# Patient Record
Sex: Male | Born: 1975
Health system: Southern US, Community
[De-identification: ages and names within clinical notes are randomized; demographics above are authoritative.]

## PROBLEM LIST (undated history)

## (undated) DIAGNOSIS — R5383 Other fatigue: Secondary | ICD-10-CM

## (undated) DIAGNOSIS — E559 Vitamin D deficiency, unspecified: Secondary | ICD-10-CM

## (undated) DIAGNOSIS — T7840XA Allergy, unspecified, initial encounter: Secondary | ICD-10-CM

## (undated) HISTORY — DX: Other fatigue: R53.83

## (undated) HISTORY — DX: Vitamin D deficiency, unspecified: E55.9

## (undated) HISTORY — DX: Allergy, unspecified, initial encounter: T78.40XA

## (undated) HISTORY — PX: NO PAST SURGERIES: SHX2092

---

## 2011-01-23 DIAGNOSIS — R768 Other specified abnormal immunological findings in serum: Secondary | ICD-10-CM | POA: Insufficient documentation

## 2011-01-23 DIAGNOSIS — R7689 Other specified abnormal immunological findings in serum: Secondary | ICD-10-CM | POA: Insufficient documentation

## 2015-12-02 ENCOUNTER — Ambulatory Visit (INDEPENDENT_AMBULATORY_CARE_PROVIDER_SITE_OTHER): Payer: BLUE CROSS/BLUE SHIELD | Admitting: Family Medicine

## 2015-12-02 ENCOUNTER — Encounter: Payer: Self-pay | Admitting: Family Medicine

## 2015-12-02 VITALS — BP 132/68 | HR 56 | Wt 158.0 lb

## 2015-12-02 DIAGNOSIS — R5383 Other fatigue: Secondary | ICD-10-CM

## 2015-12-02 DIAGNOSIS — Z0000 Encounter for general adult medical examination without abnormal findings: Secondary | ICD-10-CM

## 2015-12-02 DIAGNOSIS — R5382 Chronic fatigue, unspecified: Secondary | ICD-10-CM | POA: Diagnosis not present

## 2015-12-02 DIAGNOSIS — G4700 Insomnia, unspecified: Secondary | ICD-10-CM

## 2015-12-02 DIAGNOSIS — IMO0001 Reserved for inherently not codable concepts without codable children: Secondary | ICD-10-CM

## 2015-12-02 HISTORY — DX: Other fatigue: R53.83

## 2015-12-02 MED ORDER — TRAZODONE HCL 50 MG PO TABS: 25.0000 mg | ORAL_TABLET | Freq: Every evening | ORAL | 3 refills | Status: DC | PRN

## 2015-12-02 NOTE — Progress Notes (Signed)
Victor Jimenez is a 40 y.o. male who presents to [facility]: Primary Care Sports Medicine today for a wellness visit.  Overall, patient is doing very well.  He has no chronic medical issues and doesn't take any medication daily.   His only medical complaint is fatigue.  He admits to day time fatigue several days a week for the last few months.  He also reports difficulty staying asleep several nights a week for the past month.  Patient states he has no issues falling asleep, but will occasionally wake up after an hour of sleep and not be able to fall back asleep.  Patient reports his fatigue usually correlates with the nights he has trouble sleeping.  He denies shortness of breath and weakness.  No decreased libido or difficulty with erections.  No constipation or weight gain.  He denies anhedonia and feeling depressed.  Patient admits to snoring but claims it only happens on nights when he drinks alcohol.  No other complaints.  Health maintenance:  His tetanus was updated last year.  Declines flu shot at this time.    History reviewed. No pertinent past medical history. History reviewed. No pertinent surgical history. Social History  Substance Use Topics  . Smoking status: Light Tobacco Smoker    Types: Cigarettes  . Smokeless tobacco: Never Used  . Alcohol use Yes   family history includes Hyperlipidemia in his mother; Hypertension in his mother.  ROS as above:  No headache, visual changes, nausea, vomiting, diarrhea, constipation, dizziness, abdominal pain, skin rash, fevers, chills, night sweats, weight loss, swollen lymph nodes, body aches, joint swelling, muscle aches, chest pain, shortness of breath, mood changes, visual or auditory hallucinations.   Medications: Current Outpatient Prescriptions  Medication Sig Dispense Refill  . traZODone (DESYREL) 50 MG tablet Take 0.5-1 tablets (25-50 mg  total) by mouth at bedtime as needed for sleep. 30 tablet 3   No current facility-administered medications for this visit.    No Known Allergies   Exam:  BP 132/68   Pulse (!) 56   Wt 158 lb (71.7 kg)  Gen: Well NAD HEENT: EOMI,  MMM Lungs: Normal work of breathing. CTABL Heart: RRR no MRG Abd: NABS, Soft. Nondistended, Nontender Exts: Brisk capillary refill, warm and well perfused.  Psych: Normal mood and affect.  Normal speech  Depression screen [facility] 2/9 12/02/2015  Decreased Interest 0  Down, Depressed, Hopeless 0  PHQ - 2 Score 0  Altered sleeping 1  Tired, decreased energy 1  Change in appetite 0  Feeling bad or failure about yourself  0  Trouble concentrating 0  Moving slowly or fidgety/restless 0  Suicidal thoughts 0  PHQ-9 Score 2   GAD 7 : Generalized Anxiety Score 12/02/2015  Nervous, Anxious, on Edge 0  Control/stop worrying 0  Worry too much - different things 0  Trouble relaxing 0  Restless 0  Easily annoyed or irritable 0  Afraid - awful might happen 0  Total GAD 7 Score 0  Anxiety Difficulty Not difficult at all      Assessment and Plan: 40 y.o. male with:  1.  Wellness visit: Patient is doing very well.  He has only one minor medical complaint discussed below.  We will obtain usual fasting labs (CBC, CMP, TSH, A1c, Lipids, and vitamin D).  2.  Fatigue:  Likely related to insomnia. No symptoms of hypothyroidism, low testosterone, or depression.  - Counseled on sleep hygiene - Melatonin OTC -  Follow up if not improved and we can consider Trazodone  3. Health maintenance: Declines flu shot.     Orders Placed This Encounter  Procedures  . CBC  . Comprehensive metabolic panel    Order Specific Question:   Has the patient fasted?    Answer:   No  . TSH  . Hemoglobin A1c  . Lipid panel    Order Specific Question:   Has the patient fasted?    Answer:   No  . VITAMIN D 25 Hydroxy (Vit-D Deficiency, Fractures)    Discussed warning signs or  symptoms. Please see discharge instructions. Patient expresses understanding.

## 2015-12-02 NOTE — Patient Instructions (Signed)
Thank you for coming in today.  Come in at your convenience for fasting labs in the morning. Try over the counter melatonin at night before bed If no improvement, we can try Trazadone  Insomnia Insomnia is a sleep disorder that makes it difficult to fall asleep or to stay asleep. Insomnia can cause tiredness (fatigue), low energy, difficulty concentrating, mood swings, and poor performance at work or school.  There are three different ways to classify insomnia:  Difficulty falling asleep.  Difficulty staying asleep.  Waking up too early in the morning. Any type of insomnia can be long-term (chronic) or short-term (acute). Both are common. Short-term insomnia usually lasts for three months or less. Chronic insomnia occurs at least three times a week for longer than three months. CAUSES  Insomnia may be caused by another condition, situation, or substance, such as:  Anxiety.  Certain medicines.  Gastroesophageal reflux disease (GERD) or other gastrointestinal conditions.  Asthma or other breathing conditions.  Restless legs syndrome, sleep apnea, or other sleep disorders.  Chronic pain.  Menopause. This may include hot flashes.  Stroke.  Abuse of alcohol, tobacco, or illegal drugs.  Depression.  Caffeine.   Neurological disorders, such as Alzheimer disease.  An overactive thyroid (hyperthyroidism). The cause of insomnia may not be known. RISK FACTORS Risk factors for insomnia include:  Gender. Women are more commonly affected than men.  Age. Insomnia is more common as you get older.  Stress. This may involve your professional or personal life.  Income. Insomnia is more common in people with lower income.  Lack of exercise.   Irregular work schedule or night shifts.  Traveling between different time zones. SIGNS AND SYMPTOMS If you have insomnia, trouble falling asleep or trouble staying asleep is the main symptom. This may lead to other symptoms, such  as:  Feeling fatigued.  Feeling nervous about going to sleep.  Not feeling rested in the morning.  Having trouble concentrating.  Feeling irritable, anxious, or depressed. TREATMENT  Treatment for insomnia depends on the cause. If your insomnia is caused by an underlying condition, treatment will focus on addressing the condition. Treatment may also include:   Medicines to help you sleep.  Counseling or therapy.  Lifestyle adjustments. HOME CARE INSTRUCTIONS   Take medicines only as directed by your health care provider.  Keep regular sleeping and waking hours. Avoid naps.  Keep a sleep diary to help you and your health care provider figure out what could be causing your insomnia. Include:   When you sleep.  When you wake up during the night.  How well you sleep.   How rested you feel the next day.  Any side effects of medicines you are taking.  What you eat and drink.   Make your bedroom a comfortable place where it is easy to fall asleep:  Put up shades or special blackout curtains to block light from outside.  Use a white noise machine to block noise.  Keep the temperature cool.   Exercise regularly as directed by your health care provider. Avoid exercising right before bedtime.  Use relaxation techniques to manage stress. Ask your health care provider to suggest some techniques that may work well for you. These may include:  Breathing exercises.  Routines to release muscle tension.  Visualizing peaceful scenes.  Cut back on alcohol, caffeinated beverages, and cigarettes, especially close to bedtime. These can disrupt your sleep.  Do not overeat or eat spicy foods right before bedtime. This can lead to  digestive discomfort that can make it hard for you to sleep.  Limit screen use before bedtime. This includes:  Watching TV.  Using your smartphone, tablet, and computer.  Stick to a routine. This can help you fall asleep faster. Try to do a  quiet activity, brush your teeth, and go to bed at the same time each night.  Get out of bed if you are still awake after 15 minutes of trying to sleep. Keep the lights down, but try reading or doing a quiet activity. When you feel sleepy, go back to bed.  Make sure that you drive carefully. Avoid driving if you feel very sleepy.  Keep all follow-up appointments as directed by your health care provider. This is important. SEEK MEDICAL CARE IF:   You are tired throughout the day or have trouble in your daily routine due to sleepiness.  You continue to have sleep problems or your sleep problems get worse. SEEK IMMEDIATE MEDICAL CARE IF:   You have serious thoughts about hurting yourself or someone else.   This information is not intended to replace advice given to you by your health care provider. Make sure you discuss any questions you have with your health care provider.   Document Released: 03/10/2000 Document Revised: 12/02/2014 Document Reviewed: 12/12/2013 Elsevier Interactive Patient Education [employer]2016 Elsevier Inc.

## 2015-12-03 LAB — COMPREHENSIVE METABOLIC PANEL
ALBUMIN: 4.8 g/dL (ref 3.6–5.1)
ALK PHOS: 75 U/L (ref 40–115)
ALT: 21 U/L (ref 9–46)
AST: 21 U/L (ref 10–40)
BILIRUBIN TOTAL: 0.7 mg/dL (ref 0.2–1.2)
BUN: 23 mg/dL (ref 7–25)
CO2: 27 mmol/L (ref 20–31)
Calcium: 9.3 mg/dL (ref 8.6–10.3)
Chloride: 106 mmol/L (ref 98–110)
Creat: 1.07 mg/dL (ref 0.60–1.35)
GLUCOSE: 88 mg/dL (ref 65–99)
Potassium: 4.1 mmol/L (ref 3.5–5.3)
SODIUM: 141 mmol/L (ref 135–146)
Total Protein: 7.2 g/dL (ref 6.1–8.1)

## 2015-12-03 LAB — LIPID PANEL
CHOL/HDL RATIO: 4.6 ratio (ref <=5.0)
Cholesterol: 223 mg/dL — ABNORMAL HIGH (ref 125–200)
HDL: 49 mg/dL (ref >=40)
LDL CALC: 158 mg/dL — AB (ref <130)
Triglycerides: 81 mg/dL (ref <150)
VLDL: 16 mg/dL (ref <30)

## 2015-12-03 LAB — TSH: TSH: 1.18 mIU/L (ref 0.40–4.50)

## 2015-12-03 LAB — CBC
HCT: 43.7 % (ref 38.5–50.0)
HEMOGLOBIN: 15.1 g/dL (ref 13.2–17.1)
MCH: 30 pg (ref 27.0–33.0)
MCHC: 34.6 g/dL (ref 32.0–36.0)
MCV: 86.7 fL (ref 80.0–100.0)
MPV: 12 fL (ref 7.5–12.5)
PLATELETS: 159 10*3/uL (ref 140–400)
RBC: 5.04 MIL/uL (ref 4.20–5.80)
RDW: 13.1 % (ref 11.0–15.0)
WBC: 4.8 10*3/uL (ref 3.8–10.8)

## 2015-12-03 MED ORDER — TRAZODONE HCL 50 MG PO TABS: 25.0000 mg | ORAL_TABLET | Freq: Every evening | ORAL | 3 refills | Status: DC | PRN

## 2015-12-03 NOTE — Addendum Note (Signed)
Addended by: Rodolph BongOREY, Lavarr President S on: 12/03/2015 09:18 AM   Modules accepted: Orders

## 2015-12-04 LAB — HEMOGLOBIN A1C
Hgb A1c MFr Bld: 5.2 % (ref <5.7)
Mean Plasma Glucose: 103 mg/dL

## 2015-12-04 LAB — VITAMIN D 25 HYDROXY (VIT D DEFICIENCY, FRACTURES): VIT D 25 HYDROXY: 19 ng/mL — AB (ref 30–100)

## 2017-01-01 DIAGNOSIS — R52 Pain, unspecified: Secondary | ICD-10-CM | POA: Diagnosis not present

## 2017-01-01 DIAGNOSIS — M7701 Medial epicondylitis, right elbow: Secondary | ICD-10-CM | POA: Diagnosis not present

## 2017-05-03 ENCOUNTER — Encounter: Payer: Self-pay | Admitting: Family Medicine

## 2017-05-03 ENCOUNTER — Ambulatory Visit (INDEPENDENT_AMBULATORY_CARE_PROVIDER_SITE_OTHER): Payer: BLUE CROSS/BLUE SHIELD | Admitting: Family Medicine

## 2017-05-03 VITALS — BP 134/79 | HR 51 | Ht 66.0 in | Wt 160.0 lb

## 2017-05-03 DIAGNOSIS — Z114 Encounter for screening for human immunodeficiency virus [HIV]: Secondary | ICD-10-CM | POA: Diagnosis not present

## 2017-05-03 DIAGNOSIS — Z0000 Encounter for general adult medical examination without abnormal findings: Secondary | ICD-10-CM | POA: Diagnosis not present

## 2017-05-03 DIAGNOSIS — M7702 Medial epicondylitis, left elbow: Secondary | ICD-10-CM | POA: Diagnosis not present

## 2017-05-03 DIAGNOSIS — R5382 Chronic fatigue, unspecified: Secondary | ICD-10-CM

## 2017-05-03 DIAGNOSIS — E559 Vitamin D deficiency, unspecified: Secondary | ICD-10-CM

## 2017-05-03 DIAGNOSIS — R8290 Unspecified abnormal findings in urine: Secondary | ICD-10-CM

## 2017-05-03 HISTORY — DX: Vitamin D deficiency, unspecified: E55.9

## 2017-05-03 LAB — POCT URINALYSIS DIPSTICK
Bilirubin, UA: NEGATIVE
Blood, UA: NEGATIVE
GLUCOSE UA: NEGATIVE
Ketones, UA: NEGATIVE
LEUKOCYTES UA: NEGATIVE
NITRITE UA: NEGATIVE
PROTEIN UA: NEGATIVE
SPEC GRAV UA: 1.025 (ref 1.010–1.025)
Urobilinogen, UA: 0.2 E.U./dL
pH, UA: 6 (ref 5.0–8.0)

## 2017-05-03 NOTE — Progress Notes (Signed)
Victor Jimenez is a 42 y.o. male who presents to [facility]: Primary Care Sports Medicine today for well adult visit.  Victor Jimenez is doing pretty well overall.  He notes some mild fatigue and pain in the left medial epicondyle.  He does not take any medications regularly.  He was found to have vitamin D deficiency about 2 years ago but has stopped taking the vitamin D supplementation.  Exercises regularly and feels satisfied with his life.  He does not drink or smoke excessively.    He notes the left elbow pain is located at the medial epicondyles worse with wrist flexion.  He had a similar episode in his right elbow that was worse requiring an injection.  The current pain does not limit his work or exercise.  Additionally he notes mild fatigue.  When asked he thinks he sleeps about 6 hours a night and thinks is probably not enough.  He denies any snoring or apneic episodes.   Past Medical History:  Diagnosis Date  . Fatigue 12/02/2015  . Vitamin D deficiency 05/03/2017   No past surgical history on file. Social History   Tobacco Use  . Smoking status: Light Tobacco Smoker    Types: Cigarettes  . Smokeless tobacco: Never Used  Substance Use Topics  . Alcohol use: Yes   family history includes Hyperlipidemia in his mother; Hypertension in his mother.  ROS as above:  Medications: No current outpatient medications on file.   No current facility-administered medications for this visit.    No Known Allergies  Health Maintenance Health Maintenance  Topic Date Due  . HIV Screening  03/14/1993  . INFLUENZA VACCINE  01/03/2018 (Originally 10/25/2016)  . TETANUS/TDAP  03/27/2024     Exam:  BP 134/79   Pulse (!) 51   Ht 5\' 6"  (1.676 m)   Wt 160 lb (72.6 kg)   BMI 25.82 kg/m  Gen: Well NAD HEENT: EOMI,  MMM Lungs: Normal work of breathing. CTABL Heart: RRR no MRG Abd: NABS, Soft.  Nondistended, Nontender Exts: Brisk capillary refill, warm and well perfused.  Left elbow normal-appearing normal motion.  Tender to palpation mildly at the medial epicondyles.  Pain is reproduced with resisted wrist flexion  Depression screen [facility] 2/9 05/03/2017 12/02/2015  Decreased Interest 0 0  Down, Depressed, Hopeless 0 0  PHQ - 2 Score 0 0  Altered sleeping 0 1  Tired, decreased energy 0 1  Change in appetite 0 0  Feeling bad or failure about yourself  0 0  Trouble concentrating 0 0  Moving slowly or fidgety/restless 0 0  Suicidal thoughts 0 0  PHQ-9 Score 0 2  Difficult doing work/chores Not difficult at all -     Results for orders placed or performed in visit on 05/03/17 (from the past 72 hour(s))  POCT Urinalysis Dipstick     Status: None   Collection Time: 05/03/17  9:35 AM  Result Value Ref Range   Color, UA YELLOW    Clarity, UA CLEAR    Glucose, UA NEGATIVE    Bilirubin, UA NEGATIVE    Ketones, UA NEGATIVE    Spec Grav, UA 1.025 1.010 - 1.025   Blood, UA NEGATIVE    pH, UA 6.0 5.0 - 8.0   Protein, UA NEGATIVE    Urobilinogen, UA 0.2 0.2 or 1.0 E.U./dL   Nitrite, UA NEGATIVE    Leukocytes, UA Negative Negative   Appearance     Odor  No results found.    Assessment and Plan: 42 y.o. male with  Well adult visit doing pretty well.  Will check basic fasting labs listed below.  We will also recheck vitamin D to follow-up vitamin D deficiency and check thyroid for fatigue.    Fatigue is likely related to poor quality sleep.  Work on getting at least 8 hours of sleep at night.  Medial epicondylitis of the left elbow mild.  Eccentric exercises and stretching if not better return for injection.   Orders Placed This Encounter  Procedures  . CBC  . COMPLETE METABOLIC PANEL WITH GFR  . Lipid Panel w/reflex Direct LDL  . TSH  . VITAMIN D 25 Hydroxy (Vit-D Deficiency, Fractures)  . HIV antibody  . POCT Urinalysis Dipstick   No orders of the defined types  were placed in this encounter.    Discussed warning signs or symptoms. Please see discharge instructions. Patient expresses understanding.

## 2017-05-03 NOTE — Patient Instructions (Addendum)
Thank you for coming in today. Get labs today.  I will get results back to you soon.  Let me know if you are not doing well.  For fatigue look at sleep. Make sure you are getting around 8 hours at night.   Work on exercises for Medial Epicondylitis.    Golfer's Elbow Rehab Ask your health care provider which exercises are safe for you. Do exercises exactly as told by your health care provider and adjust them as directed. It is normal to feel mild stretching, pulling, tightness, or discomfort as you do these exercises, but you should stop right away if you feel sudden pain or your pain gets worse. Do not begin these exercises until told by your health care provider. Stretching and range of motion exercises These exercises warm up your muscles and joints and improve the movement and flexibility of your elbow. Exercise A: Wrist flexors  1. Straighten your left / right elbow in front of you with your palm up. If told by your health care provider, do this stretch with your elbow bent rather than straight. 2. With your other hand, gently pull your left / right hand and fingers toward you until you feel a gentle stretch on the top of your forearm. 3. Hold this position for __________ seconds. Repeat __________ times. Complete this exercise __________ times a day. Strengthening exercises These exercises build strength and endurance in your elbow. Endurance is the ability to use your muscles for a long time, even after several repetitions. Exercise B: Wrist flexion  1. Sit with your left / right forearm palm-up and supported on a table or other surface. 2. Let your left / right wrist extend over the edge of the surface. 3. Hold a __________ weight or a piece of rubber exercise band or tubing. Slowly curl your hand up toward your forearm. Try to only move your hand and keep the rest of your arm still. 4. Hold this position for __________ seconds. 5. Slowly return to the starting position. Repeat  __________ times. Complete this exercise __________ times a day. Exercise C: Eccentric wrist flexion 1. Sit with your left / right forearm palm-up and supported on a table or other surface. 2. Let your left / right wrist extend over the edge of the surface. 3. Hold a __________ weight or a piece of rubber exercise band or tubing. 4. Use your other hand to move your left / right hand up toward your forearm. 5. Slowly return to the starting position using only your left / right hand. Repeat __________ times. Complete this exercise __________ times a day. Exercise D: Hand turns, pronation i 1. Sit with your left / right forearm supported on a table or other surface. 2. Move your wrist so your pinkie travels toward your forearm and your thumb moves away from your forearm. 3. Hold this position for __________ seconds. 4. Slowly return to the starting position. Exercise E: Hand turns, pronation ii  1. Sit with your left / right forearm supported on a table or other surface. 2. Hold a hammer in your left / right hand. ? The exercise will be easier if you hold on near the head of the hammer. ? If you hold on toward the end of the handle, the exercise will be harder. 3. Rest your left / right hand over the edge of the table with your palm facing up. 4. Without moving your elbow, slowly turn your palm and your hand down toward the table. 5.  Hold this position for __________ seconds. 6. Slowly return to the starting position. Repeat __________ times. Complete this exercise __________ times a day. Exercise F: Shoulder blade squeeze 1. Sit in a stable chair with good posture. Do not let your back touch the back of the chair. 2. Your arms should be at your sides with your elbows bent. You can rest your forearms on a pillow. 3. Squeeze your shoulder blades together. Keep your shoulders level. Do not lift your shoulders up toward your ears. 4. Hold this position for __________ seconds. 5. Slowly  release. Repeat __________ times. Complete this exercise __________ times a day. This information is not intended to replace advice given to you by your health care provider. Make sure you discuss any questions you have with your health care provider. Document Released: 03/13/2005 Document Revised: 11/18/2015 Document Reviewed: 11/23/2014 Elsevier Interactive Patient Education  [employer].

## 2017-05-04 ENCOUNTER — Other Ambulatory Visit: Payer: Self-pay | Admitting: Family Medicine

## 2017-05-04 LAB — COMPLETE METABOLIC PANEL WITH GFR
AG Ratio: 1.8 (calc) (ref 1.0–2.5)
ALBUMIN MSPROF: 4.8 g/dL (ref 3.6–5.1)
ALT: 29 U/L (ref 9–46)
AST: 25 U/L (ref 10–40)
Alkaline phosphatase (APISO): 72 U/L (ref 40–115)
BUN: 19 mg/dL (ref 7–25)
CALCIUM: 9.4 mg/dL (ref 8.6–10.3)
CO2: 26 mmol/L (ref 20–32)
CREATININE: 1.11 mg/dL (ref 0.60–1.35)
Chloride: 105 mmol/L (ref 98–110)
GFR, EST NON AFRICAN AMERICAN: 83 mL/min/{1.73_m2} (ref >=60)
GFR, Est African American: 96 mL/min/{1.73_m2} (ref >=60)
GLOBULIN: 2.6 g/dL (ref 1.9–3.7)
GLUCOSE: 91 mg/dL (ref 65–99)
Potassium: 4 mmol/L (ref 3.5–5.3)
SODIUM: 141 mmol/L (ref 135–146)
TOTAL PROTEIN: 7.4 g/dL (ref 6.1–8.1)
Total Bilirubin: 0.8 mg/dL (ref 0.2–1.2)

## 2017-05-04 LAB — LIPID PANEL W/REFLEX DIRECT LDL
CHOLESTEROL: 234 mg/dL — AB (ref <200)
HDL: 58 mg/dL (ref >40)
LDL Cholesterol (Calc): 156 mg/dL (calc) — ABNORMAL HIGH
Non-HDL Cholesterol (Calc): 176 mg/dL (calc) — ABNORMAL HIGH (ref <130)
TRIGLYCERIDES: 91 mg/dL (ref <150)
Total CHOL/HDL Ratio: 4 (calc) (ref <5.0)

## 2017-05-04 LAB — CBC
HEMATOCRIT: 44.4 % (ref 38.5–50.0)
HEMOGLOBIN: 15.3 g/dL (ref 13.2–17.1)
MCH: 30.1 pg (ref 27.0–33.0)
MCHC: 34.5 g/dL (ref 32.0–36.0)
MCV: 87.2 fL (ref 80.0–100.0)
MPV: 12.8 fL — AB (ref 7.5–12.5)
Platelets: 158 10*3/uL (ref 140–400)
RBC: 5.09 10*6/uL (ref 4.20–5.80)
RDW: 12.5 % (ref 11.0–15.0)
WBC: 5 10*3/uL (ref 3.8–10.8)

## 2017-05-04 LAB — TSH: TSH: 1.07 m[IU]/L (ref 0.40–4.50)

## 2017-05-04 LAB — HIV ANTIBODY (ROUTINE TESTING W REFLEX): HIV 1&2 Ab, 4th Generation: NONREACTIVE

## 2017-05-04 LAB — VITAMIN D 25 HYDROXY (VIT D DEFICIENCY, FRACTURES): VIT D 25 HYDROXY: 12 ng/mL — AB (ref 30–100)

## 2017-05-04 MED ORDER — PRAVASTATIN SODIUM 40 MG PO TABS: 40.0000 mg | ORAL_TABLET | Freq: Every day | ORAL | 0 refills | Status: DC

## 2017-07-31 ENCOUNTER — Other Ambulatory Visit: Payer: Self-pay | Admitting: Family Medicine

## 2017-11-04 DIAGNOSIS — S42452A Displaced fracture of lateral condyle of left humerus, initial encounter for closed fracture: Secondary | ICD-10-CM | POA: Diagnosis not present

## 2017-11-04 DIAGNOSIS — G8911 Acute pain due to trauma: Secondary | ICD-10-CM | POA: Diagnosis not present

## 2017-11-04 DIAGNOSIS — F17210 Nicotine dependence, cigarettes, uncomplicated: Secondary | ICD-10-CM | POA: Diagnosis not present

## 2017-11-04 DIAGNOSIS — S59902A Unspecified injury of left elbow, initial encounter: Secondary | ICD-10-CM | POA: Diagnosis not present

## 2017-11-04 DIAGNOSIS — R600 Localized edema: Secondary | ICD-10-CM | POA: Diagnosis not present

## 2017-11-04 DIAGNOSIS — S59912A Unspecified injury of left forearm, initial encounter: Secondary | ICD-10-CM | POA: Diagnosis not present

## 2017-11-06 DIAGNOSIS — S42452A Displaced fracture of lateral condyle of left humerus, initial encounter for closed fracture: Secondary | ICD-10-CM | POA: Diagnosis not present

## 2017-11-21 DIAGNOSIS — S42452D Displaced fracture of lateral condyle of left humerus, subsequent encounter for fracture with routine healing: Secondary | ICD-10-CM | POA: Diagnosis not present

## 2017-11-21 DIAGNOSIS — R52 Pain, unspecified: Secondary | ICD-10-CM | POA: Diagnosis not present

## 2018-01-03 DIAGNOSIS — R200 Anesthesia of skin: Secondary | ICD-10-CM | POA: Diagnosis not present

## 2018-01-03 DIAGNOSIS — S42452D Displaced fracture of lateral condyle of left humerus, subsequent encounter for fracture with routine healing: Secondary | ICD-10-CM | POA: Diagnosis not present

## 2018-01-03 DIAGNOSIS — R29898 Other symptoms and signs involving the musculoskeletal system: Secondary | ICD-10-CM | POA: Diagnosis not present

## 2018-01-03 DIAGNOSIS — R600 Localized edema: Secondary | ICD-10-CM | POA: Diagnosis not present

## 2018-02-11 DIAGNOSIS — H5201 Hypermetropia, right eye: Secondary | ICD-10-CM | POA: Diagnosis not present

## 2018-06-13 ENCOUNTER — Telehealth: Payer: Self-pay | Admitting: Family Medicine

## 2018-06-13 NOTE — Telephone Encounter (Signed)
We should reschedule Victor Jimenez's physical.  If he has issues he needs to discuss we can schedule a visit for that but physical should be pushed off at least 1 month.

## 2018-06-13 NOTE — Telephone Encounter (Signed)
Appointment has been rescheduled. No further questions at this time.

## 2018-06-17 ENCOUNTER — Encounter: Payer: BLUE CROSS/BLUE SHIELD | Admitting: Family Medicine

## 2018-07-22 ENCOUNTER — Encounter: Payer: BLUE CROSS/BLUE SHIELD | Admitting: Family Medicine

## 2018-08-14 ENCOUNTER — Encounter: Payer: Self-pay | Admitting: Family Medicine

## 2018-08-14 ENCOUNTER — Ambulatory Visit (INDEPENDENT_AMBULATORY_CARE_PROVIDER_SITE_OTHER): Payer: BLUE CROSS/BLUE SHIELD | Admitting: Family Medicine

## 2018-08-14 ENCOUNTER — Encounter: Payer: BLUE CROSS/BLUE SHIELD | Admitting: Family Medicine

## 2018-08-14 DIAGNOSIS — G5603 Carpal tunnel syndrome, bilateral upper limbs: Secondary | ICD-10-CM

## 2018-08-14 DIAGNOSIS — G5600 Carpal tunnel syndrome, unspecified upper limb: Secondary | ICD-10-CM | POA: Insufficient documentation

## 2018-08-14 MED ORDER — GABAPENTIN 300 MG PO CAPS: 300.0000 mg | ORAL_CAPSULE | Freq: Every evening | ORAL | 1 refills | Status: DC | PRN

## 2018-08-14 MED ORDER — PRAVASTATIN SODIUM 40 MG PO TABS: 40.0000 mg | ORAL_TABLET | Freq: Every day | ORAL | 1 refills | Status: DC

## 2018-08-14 MED ORDER — VITAMIN D (ERGOCALCIFEROL) 1.25 MG (50000 UNIT) PO CAPS: 50000.0000 [IU] | ORAL_CAPSULE | ORAL | 1 refills | Status: DC

## 2018-08-14 NOTE — Progress Notes (Signed)
Victor Jimenez is a 43 y.o. male who presents to [facility]: Primary Care [facility] Medicine today for hand numbness.  Patient has bilateral left the wrist and right hand numbness.  This is been ongoing for about 2 months.  He notes he has been maintaining his current level of activity.  He notes at night he will often wake up with numbness and tingling into his hand associate with pain.  Has not tried much treatment yet for it.  He denies fevers chills nausea vomiting or diarrhea.  He has an existing medical history for hyperlipidemia.  He never started taking the pravastatin that was prescribed last year.  Additionally he has an existing history of significant vitamin D deficiency.  He could not remember to take over-the-counter vitamin D regularly.   ROS as above:  Exam:  BP (!) 141/76   Pulse 73   Wt 154 lb (69.9 kg)   SpO2 98%   BMI 24.86 kg/m  Wt Readings from Last 5 Encounters:  08/14/18 154 lb (69.9 kg)  05/03/17 160 lb (72.6 kg)  12/02/15 158 lb (71.7 kg)    Gen: Well NAD HEENT: EOMI,  MMM Lungs: Normal work of breathing. CTABL Heart: RRR no MRG Abd: NABS, Soft. Nondistended, Nontender Exts: Brisk capillary refill, warm and well perfused.  Left arm normal-appearing shoulder elbow hand and wrist. Normal shoulder motion. Normal elbow motion. Negative Tinel cubital tunnel. Normal-appearing wrist.  Normal wrist motion.  Mildly positive Tinel's at carpal tunnel.  Positive Phalen's test. Intact strength.  Pulses cap refill sensation are intact distally.  Right arm: Normal shoulder elbow and wrist appearance.  Normal motion.  Negative Tinel's cubital tunnel. Positive Tinel's at carpal tunnel.  Positive Phalen's test. Strength is intact.  Pulses cap refill and sensation intact distally.     Assessment and Plan: 43 y.o. male with  Hand numbness and tingling bilaterally.  Likely  carpal tunnel syndrome.  Plan for night cock-up wrist splint.  Also trial gabapentin at bedtime as needed.  Check back in about 6 weeks if not improving will proceed with ultrasound-guided injection.  Hyperlipidemia: Not controlled previously not taking any medications with no real life change.  Plan to restart pravastatin and check back in 6 weeks.  Vitamin D deficiency: Again nothing much is changed.  Will prescribe once weekly vitamin D and check back in about 6 weeks.  PDMP not reviewed this encounter. No orders of the defined types were placed in this encounter.  Meds ordered this encounter  Medications  . Vitamin D, Ergocalciferol, (DRISDOL) 1.25 MG (50000 UT) CAPS capsule    Sig: Take 1 capsule (50,000 Units total) by mouth every 7 (seven) days. Take for 8 total doses(weeks)    Dispense:  8 capsule    Refill:  1  . pravastatin (PRAVACHOL) 40 MG tablet    Sig: Take 1 tablet (40 mg total) by mouth daily.    Dispense:  90 tablet    Refill:  1  . gabapentin (NEURONTIN) 300 MG capsule    Sig: Take 1 capsule (300 mg total) by mouth at bedtime as needed (nerve pain).    Dispense:  90 capsule    Refill:  1     Historical information moved to improve visibility of documentation.  Past Medical History:  Diagnosis Date  . Fatigue 12/02/2015  . Vitamin D deficiency 05/03/2017   No past surgical history on file. Social History   Tobacco Use  .  Smoking status: Light Tobacco Smoker    Types: Cigarettes  . Smokeless tobacco: Never Used  Substance Use Topics  . Alcohol use: Yes   family history includes Hyperlipidemia in his mother; Hypertension in his mother.  Medications: Current Outpatient Medications  Medication Sig Dispense Refill  . gabapentin (NEURONTIN) 300 MG capsule Take 1 capsule (300 mg total) by mouth at bedtime as needed (nerve pain). 90 capsule 1  . pravastatin (PRAVACHOL) 40 MG tablet Take 1 tablet (40 mg total) by mouth daily. 90 tablet 1  . Vitamin D,  Ergocalciferol, (DRISDOL) 1.25 MG (50000 UT) CAPS capsule Take 1 capsule (50,000 Units total) by mouth every 7 (seven) days. Take for 8 total doses(weeks) 8 capsule 1   No current facility-administered medications for this visit.    No Known Allergies   Discussed warning signs or symptoms. Please see discharge instructions. Patient expresses understanding.

## 2018-08-14 NOTE — Patient Instructions (Addendum)
Thank you for coming in today. I think you have carpal tunnel syndrome.  Use the wrist brace at night.   Also restart pravastatin for cholesterol and vit D for low Vit D levels.  Ok to take with a multivitamin.  Get a pill box and put your medicine in it.  Ok to take in the morning or at bed.  Use gabapentin at bedtime as needed for nerve pain.     Carpal Tunnel Syndrome  Carpal tunnel syndrome is a condition that causes pain in your hand and arm. The carpal tunnel is a narrow area located on the palm side of your wrist. Repeated wrist motion or certain diseases may cause swelling within the tunnel. This swelling pinches the main nerve in the wrist (median nerve). What are the causes? This condition may be caused by:  Repeated wrist motions.  Wrist injuries.  Arthritis.  A cyst or tumor in the carpal tunnel.  Fluid buildup during pregnancy. Sometimes the cause of this condition is not known. What increases the risk? The following factors may make you more likely to develop this condition:  Having a job, such as being a Haematologistbutcher or a cashier, that requires you to repeatedly move your wrist in the same motion.  Being a woman.  Having certain conditions, such as: ? Diabetes. ? Obesity. ? An underactive thyroid (hypothyroidism). ? Kidney failure. What are the signs or symptoms? Symptoms of this condition include:  A tingling feeling in your fingers, especially in your thumb, index, and middle fingers.  Tingling or numbness in your hand.  An aching feeling in your entire arm, especially when your wrist and elbow are bent for a long time.  Wrist pain that goes up your arm to your shoulder.  Pain that goes down into your palm or fingers.  A weak feeling in your hands. You may have trouble grabbing and holding items. Your symptoms may feel worse during the night. How is this diagnosed? This condition is diagnosed with a medical history and physical exam. You may also  have tests, including:  Electromyogram (EMG). This test measures electrical signals sent by your nerves into the muscles.  Nerve conduction study. This test measures how well electrical signals pass through your nerves.  Imaging tests, such as X-rays, ultrasound, and MRI. These tests check for possible causes of your condition. How is this treated? This condition may be treated with:  Lifestyle changes. It is important to stop or change the activity that caused your condition.  Doing exercise and activities to strengthen your muscles and bones (physical therapy).  Learning how to use your hand again after diagnosis (occupational therapy).  Medicines for pain and inflammation. This may include medicine that is injected into your wrist.  A wrist splint.  Surgery. Follow these instructions at home: If you have a splint:  Wear the splint as told by your health care provider. Remove it only as told by your health care provider.  Loosen the splint if your fingers tingle, become numb, or turn cold and blue.  Keep the splint clean.  If the splint is not waterproof: ? Do not let it get wet. ? Cover it with a watertight covering when you take a bath or shower. Managing pain, stiffness, and swelling   If directed, put ice on the painful area: ? If you have a removable splint, remove it as told by your health care provider. ? Put ice in a plastic bag. ? Place a towel between your  skin and the bag. ? Leave the ice on for 20 minutes, 2-3 times per day. General instructions  Take over-the-counter and prescription medicines only as told by your health care provider.  Rest your wrist from any activity that may be causing your pain. If your condition is work related, talk with your employer about changes that can be made, such as getting a wrist pad to use while typing.  Do any exercises as told by your health care provider, physical therapist, or occupational therapist.  Keep all  follow-up visits as told by your health care provider. This is important. Contact a health care provider if:  You have new symptoms.  Your pain is not controlled with medicines.  Your symptoms get worse. Get help right away if:  You have severe numbness or tingling in your wrist or hand. Summary  Carpal tunnel syndrome is a condition that causes pain in your hand and arm.  It is usually caused by repeated wrist motions.  Lifestyle changes and medicines are used to treat carpal tunnel syndrome. Surgery may be recommended.  Follow your health care provider's instructions about wearing a splint, resting from activity, keeping follow-up visits, and calling for help. This information is not intended to replace advice given to you by your health care provider. Make sure you discuss any questions you have with your health care provider. Document Released: 03/10/2000 Document Revised: 07/20/2017 Document Reviewed: 07/20/2017 Elsevier Interactive Patient Education  2019 [employer].

## 2018-09-04 DIAGNOSIS — Z03818 Encounter for observation for suspected exposure to other biological agents ruled out: Secondary | ICD-10-CM | POA: Diagnosis not present

## 2018-09-25 ENCOUNTER — Ambulatory Visit: Payer: BLUE CROSS/BLUE SHIELD | Admitting: Family Medicine

## 2019-03-07 DIAGNOSIS — R1110 Vomiting, unspecified: Secondary | ICD-10-CM | POA: Diagnosis not present

## 2019-03-07 DIAGNOSIS — R509 Fever, unspecified: Secondary | ICD-10-CM | POA: Diagnosis not present

## 2019-03-07 DIAGNOSIS — Z20828 Contact with and (suspected) exposure to other viral communicable diseases: Secondary | ICD-10-CM | POA: Diagnosis not present

## 2019-03-07 DIAGNOSIS — M791 Myalgia, unspecified site: Secondary | ICD-10-CM | POA: Diagnosis not present

## 2019-06-06 DIAGNOSIS — M9902 Segmental and somatic dysfunction of thoracic region: Secondary | ICD-10-CM | POA: Diagnosis not present

## 2019-06-06 DIAGNOSIS — M9901 Segmental and somatic dysfunction of cervical region: Secondary | ICD-10-CM | POA: Diagnosis not present

## 2019-06-06 DIAGNOSIS — M6240 Contracture of muscle, unspecified site: Secondary | ICD-10-CM | POA: Diagnosis not present

## 2019-06-06 DIAGNOSIS — M9907 Segmental and somatic dysfunction of upper extremity: Secondary | ICD-10-CM | POA: Diagnosis not present

## 2019-08-18 DIAGNOSIS — J339 Nasal polyp, unspecified: Secondary | ICD-10-CM | POA: Diagnosis not present

## 2019-08-18 DIAGNOSIS — R438 Other disturbances of smell and taste: Secondary | ICD-10-CM | POA: Diagnosis not present

## 2019-09-17 ENCOUNTER — Ambulatory Visit (INDEPENDENT_AMBULATORY_CARE_PROVIDER_SITE_OTHER): Payer: BC Managed Care – PPO | Admitting: Medical-Surgical

## 2019-09-17 ENCOUNTER — Ambulatory Visit (INDEPENDENT_AMBULATORY_CARE_PROVIDER_SITE_OTHER): Payer: BC Managed Care – PPO

## 2019-09-17 ENCOUNTER — Encounter: Payer: Self-pay | Admitting: Medical-Surgical

## 2019-09-17 ENCOUNTER — Other Ambulatory Visit: Payer: Self-pay

## 2019-09-17 VITALS — BP 106/65 | HR 76 | Temp 98.2°F | Ht 66.0 in | Wt 159.0 lb

## 2019-09-17 DIAGNOSIS — M79641 Pain in right hand: Secondary | ICD-10-CM | POA: Diagnosis not present

## 2019-09-17 DIAGNOSIS — S6991XA Unspecified injury of right wrist, hand and finger(s), initial encounter: Secondary | ICD-10-CM

## 2019-09-17 DIAGNOSIS — M7989 Other specified soft tissue disorders: Secondary | ICD-10-CM | POA: Diagnosis not present

## 2019-09-17 MED ORDER — MELOXICAM 15 MG PO TABS: ORAL_TABLET | ORAL | 0 refills | Status: DC

## 2019-09-17 NOTE — Progress Notes (Signed)
Subjective:    CC: Right third finger injury  HPI: Pleasant 44 year old male presenting today to establish care with a new PCP and discuss a right third finger injury.  Reports he was playing soccer approximately 1 month ago and injured his right third finger.  He does not remember exactly what happened to the finger but noted that it started to swell and was painful afterwards.  Over the last month he has noticed a decrease in overall pain and the swelling has improved.  He reports the knuckle is now just tender to pressure and if he is using it such as knocking on doors.  He has not taken anything over-the-counter and has not used any splinting or braces for the finger.  I reviewed the past medical history, family history, social history, surgical history, and allergies today and no changes were needed.  Please see the problem list section below in epic for further details.  Past Medical History: Past Medical History:  Diagnosis Date  . Fatigue 12/02/2015  . Vitamin D deficiency 05/03/2017   Past Surgical History: History reviewed. No pertinent surgical history. Social History: Social History   Socioeconomic History  . Marital status: Legally Separated    Spouse name: Not on file  . Number of children: Not on file  . Years of education: Not on file  . Highest education level: Not on file  Occupational History  . Not on file  Tobacco Use  . Smoking status: Light Tobacco Smoker    Types: Cigarettes  . Smokeless tobacco: Never Used  Substance and Sexual Activity  . Alcohol use: Yes  . Drug use: No  . Sexual activity: Yes    Birth control/protection: None  Other Topics Concern  . Not on file  Social History Narrative  . Not on file   Social Determinants of Health   Financial Resource Strain:   . Difficulty of Paying Living Expenses:   Food Insecurity:   . Worried About Charity fundraiser in the Last Year:   . Arboriculturist in the Last Year:   Transportation Needs:   .  Film/video editor (Medical):   Marland Kitchen Lack of Transportation (Non-Medical):   Physical Activity:   . Days of Exercise per Week:   . Minutes of Exercise per Session:   Stress:   . Feeling of Stress :   Social Connections:   . Frequency of Communication with Friends and Family:   . Frequency of Social Gatherings with Friends and Family:   . Attends Religious Services:   . Active Member of Clubs or Organizations:   . Attends Archivist Meetings:   Marland Kitchen Marital Status:    Family History: Family History  Problem Relation Age of Onset  . Hyperlipidemia Mother   . Hypertension Mother    Allergies: No Known Allergies Medications: See med rec.  Review of Systems: No fevers, chills, night sweats, weight loss, chest pain, or shortness of breath.   Objective:    General: Well Developed, well nourished, and in no acute distress.  Neuro: Alert and oriented x3.  HEENT: Normocephalic, atraumatic.  Skin: Warm and dry. Cardiac: Regular rate and rhythm, no murmurs rubs or gallops, no lower extremity edema.  Respiratory: Clear to auscultation bilaterally. Not using accessory muscles, speaking in full sentences. MSK: Right hand strength and range of motion normal.  Right third finger proximal interphalangeal joint enlarged, tender to palpation on the lateral and medial sides.  Mild discomfort with full flexion  at the PIP joint of the right hand third finger.   Impression and Recommendations:    1. Finger injury, right, initial encounter Right hand x-rays today.  As this is a month out, no obvious need for splinting or buddy taping.  Will start meloxicam 15 mg daily x14 days then daily as needed to see if this helps with some of the swelling.  Ending x-ray results to determine if any next steps are needed.  Reviewed conservative measures including heat/ice, Tylenol. - DG Hand Complete Right; Future - meloxicam (MOBIC) 15 MG tablet; Take 1 tablet (15mg ) daily for 14 days then 1 tablet  (15mg ) daily as needed.  Dispense: 30 tablet; Refill: 0  Return for physical as scheduled 10/02/2019. ___________________________________________ , DNP, APRN, FNP-BC Primary Care and Sports Medicine [facility] [place]

## 2019-10-02 ENCOUNTER — Encounter: Payer: Self-pay | Admitting: Medical-Surgical

## 2019-10-02 ENCOUNTER — Ambulatory Visit (INDEPENDENT_AMBULATORY_CARE_PROVIDER_SITE_OTHER): Payer: BC Managed Care – PPO | Admitting: Medical-Surgical

## 2019-10-02 VITALS — BP 119/83 | HR 56 | Temp 97.8°F | Ht 65.25 in | Wt 158.1 lb

## 2019-10-02 DIAGNOSIS — E785 Hyperlipidemia, unspecified: Secondary | ICD-10-CM | POA: Diagnosis not present

## 2019-10-02 DIAGNOSIS — Z1159 Encounter for screening for other viral diseases: Secondary | ICD-10-CM | POA: Diagnosis not present

## 2019-10-02 DIAGNOSIS — Z0000 Encounter for general adult medical examination without abnormal findings: Secondary | ICD-10-CM | POA: Diagnosis not present

## 2019-10-02 DIAGNOSIS — E1169 Type 2 diabetes mellitus with other specified complication: Secondary | ICD-10-CM

## 2019-10-02 LAB — COMPLETE METABOLIC PANEL WITH GFR
AG Ratio: 2 (calc) (ref 1.0–2.5)
ALT: 29 U/L (ref 9–46)
AST: 24 U/L (ref 10–40)
Albumin: 4.7 g/dL (ref 3.6–5.1)
Alkaline phosphatase (APISO): 74 U/L (ref 36–130)
BUN/Creatinine Ratio: 23 (calc) — ABNORMAL HIGH (ref 6–22)
BUN: 27 mg/dL — ABNORMAL HIGH (ref 7–25)
CO2: 30 mmol/L (ref 20–32)
Calcium: 9.5 mg/dL (ref 8.6–10.3)
Chloride: 105 mmol/L (ref 98–110)
Creat: 1.18 mg/dL (ref 0.60–1.35)
GFR, Est African American: 88 mL/min/{1.73_m2} (ref >=60)
GFR, Est Non African American: 76 mL/min/{1.73_m2} (ref >=60)
Globulin: 2.4 g/dL (calc) (ref 1.9–3.7)
Glucose, Bld: 90 mg/dL (ref 65–99)
Potassium: 4.3 mmol/L (ref 3.5–5.3)
Sodium: 141 mmol/L (ref 135–146)
Total Bilirubin: 0.8 mg/dL (ref 0.2–1.2)
Total Protein: 7.1 g/dL (ref 6.1–8.1)

## 2019-10-02 LAB — LIPID PANEL
Cholesterol: 272 mg/dL — ABNORMAL HIGH (ref <200)
HDL: 58 mg/dL (ref >=40)
LDL Cholesterol (Calc): 187 mg/dL (calc) — ABNORMAL HIGH
Non-HDL Cholesterol (Calc): 214 mg/dL (calc) — ABNORMAL HIGH (ref <130)
Total CHOL/HDL Ratio: 4.7 (calc) (ref <5.0)
Triglycerides: 137 mg/dL (ref <150)

## 2019-10-02 LAB — CBC
HCT: 45.8 % (ref 38.5–50.0)
Hemoglobin: 15.3 g/dL (ref 13.2–17.1)
MCH: 29.7 pg (ref 27.0–33.0)
MCHC: 33.4 g/dL (ref 32.0–36.0)
MCV: 88.9 fL (ref 80.0–100.0)
MPV: 11.9 fL (ref 7.5–12.5)
Platelets: 188 10*3/uL (ref 140–400)
RBC: 5.15 10*6/uL (ref 4.20–5.80)
RDW: 12.8 % (ref 11.0–15.0)
WBC: 5.1 10*3/uL (ref 3.8–10.8)

## 2019-10-02 LAB — HEPATITIS C ANTIBODY
Hepatitis C Ab: NONREACTIVE
SIGNAL TO CUT-OFF: 0.01 (ref <1.00)

## 2019-10-02 NOTE — Patient Instructions (Signed)
Preventive Care 41-42 Years Old, Male Preventive care refers to lifestyle choices and visits with your health care provider that can promote health and wellness. This includes:  A yearly physical exam. This is also called an annual well check.  Regular dental and eye exams.  Immunizations.  Screening for certain conditions.  Healthy lifestyle choices, such as eating a healthy diet, getting regular exercise, not using drugs or products that contain nicotine and tobacco, and limiting alcohol use. What can I expect for my preventive care visit? Physical exam Your health care provider will check:  Height and weight. These may be used to calculate body mass index (BMI), which is a measurement that tells if you are at a healthy weight.  Heart rate and blood pressure.  Your skin for abnormal spots. Counseling Your health care provider may ask you questions about:  Alcohol, tobacco, and drug use.  Emotional well-being.  Home and relationship well-being.  Sexual activity.  Eating habits.  Work and work Statistician. What immunizations do I need?  Influenza (flu) vaccine  This is recommended every year. Tetanus, diphtheria, and pertussis (Tdap) vaccine  You may need a Td booster every 10 years. Varicella (chickenpox) vaccine  You may need this vaccine if you have not already been vaccinated. Zoster (shingles) vaccine  You may need this after age 64. Measles, mumps, and rubella (MMR) vaccine  You may need at least one dose of MMR if you were born in 1957 or later. You may also need a second dose. Pneumococcal conjugate (PCV13) vaccine  You may need this if you have certain conditions and were not previously vaccinated. Pneumococcal polysaccharide (PPSV23) vaccine  You may need one or two doses if you smoke cigarettes or if you have certain conditions. Meningococcal conjugate (MenACWY) vaccine  You may need this if you have certain conditions. Hepatitis A  vaccine  You may need this if you have certain conditions or if you travel or work in places where you may be exposed to hepatitis A. Hepatitis B vaccine  You may need this if you have certain conditions or if you travel or work in places where you may be exposed to hepatitis B. Haemophilus influenzae type b (Hib) vaccine  You may need this if you have certain risk factors. Human papillomavirus (HPV) vaccine  If recommended by your health care provider, you may need three doses over 6 months. You may receive vaccines as individual doses or as more than one vaccine together in one shot (combination vaccines). Talk with your health care provider about the risks and benefits of combination vaccines. What tests do I need? Blood tests  Lipid and cholesterol levels. These may be checked every 5 years, or more frequently if you are over 60 years old.  Hepatitis C test.  Hepatitis B test. Screening  Lung cancer screening. You may have this screening every year starting at age 43 if you have a 30-pack-year history of smoking and currently smoke or have quit within the past 15 years.  Prostate cancer screening. Recommendations will vary depending on your family history and other risks.  Colorectal cancer screening. All adults should have this screening starting at age 72 and continuing until age 2. Your health care provider may recommend screening at age 14 if you are at increased risk. You will have tests every 1-10 years, depending on your results and the type of screening test.  Diabetes screening. This is done by checking your blood sugar (glucose) after you have not eaten  for a while (fasting). You may have this done every 1-3 years.  Sexually transmitted disease (STD) testing. Follow these instructions at home: Eating and drinking  Eat a diet that includes fresh fruits and vegetables, whole grains, lean protein, and low-fat dairy products.  Take vitamin and mineral supplements as  recommended by your health care provider.  Do not drink alcohol if your health care provider tells you not to drink.  If you drink alcohol: ? Limit how much you have to 0-2 drinks a day. ? Be aware of how much alcohol is in your drink. In the [place] one drink equals one 12 oz bottle of beer (355 mL), one 5 oz glass of wine (148 mL), or one 1 oz glass of hard liquor (44 mL). Lifestyle  Take daily care of your teeth and gums.  Stay active. Exercise for at least 30 minutes on 5 or more days each week.  Do not use any products that contain nicotine or tobacco, such as cigarettes, e-cigarettes, and chewing tobacco. If you need help quitting, ask your health care provider.  If you are sexually active, practice safe sex. Use a condom or other form of protection to prevent STIs (sexually transmitted infections).  Talk with your health care provider about taking a low-dose aspirin every day starting at age 53. What's next?  Go to your health care provider once a year for a well check visit.  Ask your health care provider how often you should have your eyes and teeth checked.  Stay up to date on all vaccines. This information is not intended to replace advice given to you by your health care provider. Make sure you discuss any questions you have with your health care provider. Document Revised: 03/07/2018 Document Reviewed: 03/07/2018 Elsevier Patient Education  2020 [employer].

## 2019-10-02 NOTE — Progress Notes (Signed)
HPI: Victor Jimenez is a 44 y.o. male who  has a past medical history of Allergy, Fatigue (12/02/2015), and Vitamin D deficiency (05/03/2017).  he presents to [facility] today, 10/02/19,  for chief complaint of: Annual physical exam  Dental care: every 6 months, no concerns Eye exam: last year, no correction Exercise: regular exercise, plays soccer Diet: no dietary restrictions, eats a lot of fast food  Past medical, surgical, social and family history reviewed:  Patient Active Problem List   Diagnosis Date Noted  . Carpal tunnel syndrome 08/14/2018  . Vitamin D deficiency 05/03/2017  . Medial epicondylitis of elbow, left 05/03/2017  . Fatigue 12/02/2015  . Insomnia 12/02/2015  . Helicobacter pylori antibody positive 01/23/2011    History reviewed. No pertinent surgical history.  Social History   Tobacco Use  . Smoking status: Light Tobacco Smoker    Types: Cigarettes  . Smokeless tobacco: Never Used  Substance Use Topics  . Alcohol use: Yes    Alcohol/week: 8.0 - 10.0 standard drinks    Types: 8 - 10 Cans of beer per week    Family History  Problem Relation Age of Onset  . Hyperlipidemia Mother   . Hypertension Mother      Current medication list and allergy/intolerance information reviewed:    Current Outpatient Medications  Medication Sig Dispense Refill  . fluticasone (FLONASE) 50 MCG/ACT nasal spray Place 2 sprays into both nostrils daily as needed.    . meloxicam (MOBIC) 15 MG tablet Take 1 tablet ('15mg'$ ) daily for 14 days then 1 tablet ('15mg'$ ) daily as needed. 30 tablet 0   No current facility-administered medications for this visit.    No Known Allergies    Review of Systems:  Constitutional:  No  fever, no chills, No recent illness, No unintentional weight changes. No significant fatigue.   HEENT: No  headache, no vision change, no hearing change, No sore throat, No  sinus pressure  Cardiac: No  chest pain, No   pressure, No palpitations, No  Orthopnea  Respiratory:  No  shortness of breath. No  Cough  Gastrointestinal: No  abdominal pain, No  nausea, No  vomiting,  No  blood in stool, No  diarrhea, No  constipation   Musculoskeletal: No new myalgia/arthralgia  Skin: No  Rash, No other wounds/concerning lesions  Genitourinary: No  incontinence, No  abnormal genital bleeding, No abnormal genital discharge  Hem/Onc: No  easy bruising/bleeding, No  abnormal lymph node  Endocrine: No cold intolerance,  No heat intolerance. No polyuria/polydipsia/polyphagia   Neurologic: No  weakness, No  dizziness, No  slurred speech/focal weakness/facial droop  Psychiatric: No  concerns with depression, No  concerns with anxiety, No sleep problems, No mood problems  Exam:  BP 119/83   Pulse (!) 56   Temp 97.8 F (36.6 C) (Oral)   Ht 5' 5.25" (1.657 m)   Wt 158 lb 1.6 oz (71.7 kg)   SpO2 99%   BMI 26.11 kg/m   Constitutional: VS see above. General Appearance: alert, well-developed, well-nourished, NAD  Eyes: Normal lids and conjunctive, non-icteric sclera  Ears, Nose, Mouth, Throat: MMM, Normal external inspection ears/nares/mouth/lips/gums. TM normal bilaterally. Pharynx/tonsils no erythema, no exudate. Nasal mucosa normal.   Neck: No masses, trachea midline. No thyroid enlargement. No tenderness/mass appreciated. No lymphadenopathy  Respiratory: Normal respiratory effort. no wheeze, no rhonchi, no rales  Cardiovascular: S1/S2 normal, no murmur, no rub/gallop auscultated. RRR. No lower extremity edema. Pedal pulse II/IV bilaterally DP and  PT. No carotid bruit or JVD. No abdominal aortic bruit.  Gastrointestinal: Nontender, no masses. No hepatomegaly, no splenomegaly. No hernia appreciated. Bowel sounds normal. Rectal exam deferred.   Musculoskeletal: Gait normal. No clubbing/cyanosis of digits. DIP joint of right third finger still enlarged on the lateral and medial aspects, notably less swollen  than previous appointment.  Neurological: Normal balance/coordination. No tremor. No cranial nerve deficit on limited exam. Motor and sensation intact and symmetric. Cerebellar reflexes intact.   Skin: warm, dry, intact. No rash/ulcer. No concerning nevi or subq nodules on limited exam.    Psychiatric: Normal judgment/insight. Normal mood and affect. Oriented x3.    No results found for this or any previous visit (from the past 72 hour(s)).  No results found.   ASSESSMENT/PLAN:   1. Annual physical exam Checking CBC, CMP, and Lipid panel today. Up to date on preventative care for now. Recommend heart healthy dietary modifications. - CBC - COMPLETE METABOLIC PANEL WITH GFR - Lipid panel  2. Need for hepatitis C screening test Discussed screening recommendations. Patient agreeable so we will add this to the blood work today. - Hepatitis C antibody   Orders Placed This Encounter  Procedures  . Hepatitis C antibody  . CBC  . COMPLETE METABOLIC PANEL WITH GFR  . Lipid panel    No orders of the defined types were placed in this encounter.   Patient Instructions  Preventive Care 50-66 Years Old, Male Preventive care refers to lifestyle choices and visits with your health care provider that can promote health and wellness. This includes:  A yearly physical exam. This is also called an annual well check.  Regular dental and eye exams.  Immunizations.  Screening for certain conditions.  Healthy lifestyle choices, such as eating a healthy diet, getting regular exercise, not using drugs or products that contain nicotine and tobacco, and limiting alcohol use. What can I expect for my preventive care visit? Physical exam Your health care provider will check:  Height and weight. These may be used to calculate body mass index (BMI), which is a measurement that tells if you are at a healthy weight.  Heart rate and blood pressure.  Your skin for abnormal  spots. Counseling Your health care provider may ask you questions about:  Alcohol, tobacco, and drug use.  Emotional well-being.  Home and relationship well-being.  Sexual activity.  Eating habits.  Work and work Statistician. What immunizations do I need?  Influenza (flu) vaccine  This is recommended every year. Tetanus, diphtheria, and pertussis (Tdap) vaccine  You may need a Td booster every 10 years. Varicella (chickenpox) vaccine  You may need this vaccine if you have not already been vaccinated. Zoster (shingles) vaccine  You may need this after age 82. Measles, mumps, and rubella (MMR) vaccine  You may need at least one dose of MMR if you were born in 1957 or later. You may also need a second dose. Pneumococcal conjugate (PCV13) vaccine  You may need this if you have certain conditions and were not previously vaccinated. Pneumococcal polysaccharide (PPSV23) vaccine  You may need one or two doses if you smoke cigarettes or if you have certain conditions. Meningococcal conjugate (MenACWY) vaccine  You may need this if you have certain conditions. Hepatitis A vaccine  You may need this if you have certain conditions or if you travel or work in places where you may be exposed to hepatitis A. Hepatitis B vaccine  You may need this if you have certain  conditions or if you travel or work in places where you may be exposed to hepatitis B. Haemophilus influenzae type b (Hib) vaccine  You may need this if you have certain risk factors. Human papillomavirus (HPV) vaccine  If recommended by your health care provider, you may need three doses over 6 months. You may receive vaccines as individual doses or as more than one vaccine together in one shot (combination vaccines). Talk with your health care provider about the risks and benefits of combination vaccines. What tests do I need? Blood tests  Lipid and cholesterol levels. These may be checked every 5 years, or  more frequently if you are over 28 years old.  Hepatitis C test.  Hepatitis B test. Screening  Lung cancer screening. You may have this screening every year starting at age 78 if you have a 30-pack-year history of smoking and currently smoke or have quit within the past 15 years.  Prostate cancer screening. Recommendations will vary depending on your family history and other risks.  Colorectal cancer screening. All adults should have this screening starting at age 2 and continuing until age 45. Your health care provider may recommend screening at age 28 if you are at increased risk. You will have tests every 1-10 years, depending on your results and the type of screening test.  Diabetes screening. This is done by checking your blood sugar (glucose) after you have not eaten for a while (fasting). You may have this done every 1-3 years.  Sexually transmitted disease (STD) testing. Follow these instructions at home: Eating and drinking  Eat a diet that includes fresh fruits and vegetables, whole grains, lean protein, and low-fat dairy products.  Take vitamin and mineral supplements as recommended by your health care provider.  Do not drink alcohol if your health care provider tells you not to drink.  If you drink alcohol: ? Limit how much you have to 0-2 drinks a day. ? Be aware of how much alcohol is in your drink. In the [place] one drink equals one 12 oz bottle of beer (355 mL), one 5 oz glass of wine (148 mL), or one 1 oz glass of hard liquor (44 mL). Lifestyle  Take daily care of your teeth and gums.  Stay active. Exercise for at least 30 minutes on 5 or more days each week.  Do not use any products that contain nicotine or tobacco, such as cigarettes, e-cigarettes, and chewing tobacco. If you need help quitting, ask your health care provider.  If you are sexually active, practice safe sex. Use a condom or other form of protection to prevent STIs (sexually transmitted  infections).  Talk with your health care provider about taking a low-dose aspirin every day starting at age 14. What's next?  Go to your health care provider once a year for a well check visit.  Ask your health care provider how often you should have your eyes and teeth checked.  Stay up to date on all vaccines. This information is not intended to replace advice given to you by your health care provider. Make sure you discuss any questions you have with your health care provider. Document Revised: 03/07/2018 Document Reviewed: 03/07/2018 Elsevier Patient Education  [place].  Follow-up plan: Return in about 1 year (around 10/01/2020) for annual physical exam or sooner if needed.  Clearnce Sorrel, DNP, APRN, FNP-BC [facility]

## 2019-10-03 MED ORDER — PRAVASTATIN SODIUM 40 MG PO TABS: 40.0000 mg | ORAL_TABLET | Freq: Every day | ORAL | 1 refills | Status: DC

## 2019-10-03 NOTE — Addendum Note (Signed)
Addended byChristen Butter on: 10/03/2019 03:06 PM   Modules accepted: Orders

## 2019-10-08 ENCOUNTER — Other Ambulatory Visit: Payer: Self-pay | Admitting: Medical-Surgical

## 2019-10-08 DIAGNOSIS — S6991XA Unspecified injury of right wrist, hand and finger(s), initial encounter: Secondary | ICD-10-CM

## 2019-10-14 ENCOUNTER — Encounter: Payer: Self-pay | Admitting: Medical-Surgical

## 2019-10-14 ENCOUNTER — Ambulatory Visit (INDEPENDENT_AMBULATORY_CARE_PROVIDER_SITE_OTHER): Payer: BC Managed Care – PPO | Admitting: Sports Medicine

## 2019-10-14 VITALS — BP 119/71 | HR 51 | Temp 97.9°F | Ht 65.25 in | Wt 158.1 lb

## 2019-10-14 DIAGNOSIS — M79644 Pain in right finger(s): Secondary | ICD-10-CM | POA: Diagnosis not present

## 2019-10-14 DIAGNOSIS — S6991XD Unspecified injury of right wrist, hand and finger(s), subsequent encounter: Secondary | ICD-10-CM | POA: Diagnosis not present

## 2019-10-14 NOTE — Progress Notes (Signed)
    Procedures performed today:    None.  Independent interpretation of notes and tests performed by another provider:   X-rays personally reviewed, I do see some soft tissue swelling, a mild what appears to be boutonnieres deformity, and what appears to be an articular step-off on the lateral view at the base of the middle phalanx.  Brief History, Exam, Impression, and Recommendations:    Pain of right middle finger This is a pleasant 44 year old male, a month ago he was playing soccer, he does not recall any injuries, since then he has had pain that he localizes over the radial and ulnar aspects of his right third PIP, on exam he has tenderness at this location, collaterals are stable, good strength to flexion and extension at the MCP, DIP, PIP. X-rays were read as negative but there is potentially a small step-off in the articular surface at the proximal aspect of the middle phalanx. He will continue meloxicam, I am going to add some hand physical therapy for the next month, if he still has discomfort we will likely proceed with an MRI.    ___________________________________________ Ihor Austin. Benjamin Stain, M.D., ABFM., CAQSM. Primary Care and Sports Medicine [facility]  Adjunct Instructor of Family Medicine  [employer]

## 2019-10-14 NOTE — Progress Notes (Deleted)
  Subjective:    CC:   HPI:   I reviewed the past medical history, family history, social history, surgical history, and allergies today and no changes were needed.  Please see the problem list section below in epic for further details.  Past Medical History: Past Medical History:  Diagnosis Date  . Allergy   . Fatigue 12/02/2015  . Vitamin D deficiency 05/03/2017   Past Surgical History: History reviewed. No pertinent surgical history. Social History: Social History   Socioeconomic History  . Marital status: Legally Separated    Spouse name: Not on file  . Number of children: Not on file  . Years of education: Not on file  . Highest education level: Not on file  Occupational History  . Not on file  Tobacco Use  . Smoking status: Light Tobacco Smoker    Types: Cigarettes  . Smokeless tobacco: Never Used  Vaping Use  . Vaping Use: Never used  Substance and Sexual Activity  . Alcohol use: Yes    Alcohol/week: 8.0 - 10.0 standard drinks    Types: 8 - 10 Cans of beer per week  . Drug use: No  . Sexual activity: Yes    Birth control/protection: None  Other Topics Concern  . Not on file  Social History Narrative  . Not on file   Social Determinants of Health   Financial Resource Strain:   . Difficulty of Paying Living Expenses:   Food Insecurity:   . Worried About Programme researcher, broadcasting/film/video in the Last Year:   . Barista in the Last Year:   Transportation Needs:   . Freight forwarder (Medical):   Marland Kitchen Lack of Transportation (Non-Medical):   Physical Activity:   . Days of Exercise per Week:   . Minutes of Exercise per Session:   Stress:   . Feeling of Stress :   Social Connections:   . Frequency of Communication with Friends and Family:   . Frequency of Social Gatherings with Friends and Family:   . Attends Religious Services:   . Active Member of Clubs or Organizations:   . Attends Banker Meetings:   Marland Kitchen Marital Status:    Family  History: Family History  Problem Relation Age of Onset  . Hyperlipidemia Mother   . Hypertension Mother    Allergies: No Known Allergies Medications: See med rec.  Review of Systems: See HPI for pertinent positives and negatives.   Objective:    General: Well Developed, well nourished, and in no acute distress.  Neuro: Alert and oriented x3.  HEENT: Normocephalic, atraumatic.  Skin: Warm and dry. Cardiac: Regular rate and rhythm, no murmurs rubs or gallops, no lower extremity edema.  Respiratory: Clear to auscultation bilaterally. Not using accessory muscles, speaking in full sentences.   Impression and Recommendations:    No problem-specific Assessment & Plan notes found for this encounter.  No follow-ups on file.  ___________________________________________ Thayer Ohm, DNP, APRN, FNP-BC Primary Care and Sports Medicine [facility]

## 2019-10-14 NOTE — Assessment & Plan Note (Signed)
This is a pleasant 44 year old male, a month ago he was playing soccer, he does not recall any injuries, since then he has had pain that he localizes over the radial and ulnar aspects of his right third PIP, on exam he has tenderness at this location, collaterals are stable, good strength to flexion and extension at the MCP, DIP, PIP. X-rays were read as negative but there is potentially a small step-off in the articular surface at the proximal aspect of the middle phalanx. He will continue meloxicam, I am going to add some hand physical therapy for the next month, if he still has discomfort we will likely proceed with an MRI.

## 2019-10-21 ENCOUNTER — Ambulatory Visit: Payer: BC Managed Care – PPO | Admitting: Physical Therapy

## 2020-01-07 ENCOUNTER — Ambulatory Visit (INDEPENDENT_AMBULATORY_CARE_PROVIDER_SITE_OTHER): Payer: BC Managed Care – PPO | Admitting: Medical-Surgical

## 2020-01-07 ENCOUNTER — Encounter: Payer: Self-pay | Admitting: Medical-Surgical

## 2020-01-07 VITALS — BP 135/91 | HR 55 | Temp 98.0°F | Ht 65.25 in | Wt 159.9 lb

## 2020-01-07 DIAGNOSIS — M20021 Boutonniere deformity of right finger(s): Secondary | ICD-10-CM

## 2020-01-07 NOTE — Progress Notes (Signed)
Subjective:    CC: Continued right hand pain  HPI: Pleasant 44 year old male presenting today for reevaluation of continued right middle finger pain.  His middle finger was injured while playing soccer several months ago and he did not seek immediate evaluation.  Approximately 4 weeks after his injury, he established with me for further evaluation and recommendations.  We did do an x-ray which showed no fractures or dislocations.  We did try meloxicam to see if this would help reduce the inflammation and pain but this was unsuccessful.  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 He is interested to see if there is any other treatment that may help.  I reviewed the past medical history, family history, social history, surgical history, and allergies today and no changes were needed.  Please see the problem list section below in epic for further details.  Past Medical History: Past Medical History:  Diagnosis Date  . Allergy   . Fatigue 12/02/2015  . Vitamin D deficiency 05/03/2017   Past Surgical History: Past Surgical History:  Procedure Laterality Date  . NO PAST SURGERIES     Social History: Social History   Socioeconomic History  . Marital status: Legally Separated    Spouse name: Not on file  . Number of children: Not on file  . Years of education: Not on file  . Highest education level: Not on file  Occupational History  . Not on file  Tobacco Use  . Smoking status: Never Smoker  . Smokeless tobacco: Never Used  Vaping Use  . Vaping Use: Never used  Substance and Sexual Activity  . Alcohol use: Yes  . Drug use: No  . Sexual activity: Not Currently    Partners: Female  Other Topics Concern  . Not on file  Social History Narrative  . Not on file   Social Determinants of Health   Financial Resource Strain:   . Difficulty of Paying Living Expenses: Not on file  Food Insecurity:   . Worried About Programme researcher, broadcasting/film/video in the Last Year: Not on  file  . Ran Out of Food in the Last Year: Not on file  Transportation Needs:   . Lack of Transportation (Medical): Not on file  . Lack of Transportation (Non-Medical): Not on file  Physical Activity:   . Days of Exercise per Week: Not on file  . Minutes of Exercise per Session: Not on file  Stress:   . Feeling of Stress : Not on file  Social Connections:   . Frequency of Communication with Friends and Family: Not on file  . Frequency of Social Gatherings with Friends and Family: Not on file  . Attends Religious Services: Not on file  . Active Member of Clubs or Organizations: Not on file  . Attends Banker Meetings: Not on file  . Marital Status: Not on file   Family History: Family History  Problem Relation Age of Onset  . Hyperlipidemia Mother   . Hypertension Mother    Allergies: No Known Allergies Medications: See med rec.  Review of Systems: See HPI for pertinent positives and negatives.   Objective:    General: Well Developed, well nourished, and in no acute distress.  Neuro: Alert and oriented x3.  HEENT: Normocephalic, atraumatic.  Skin: Warm and dry. Cardiac: Regular rate and rhythm, no murmurs rubs or gallops, no lower extremity edema.  Respiratory: Clear to auscultation bilaterally. Not using accessory muscles, speaking in full sentences.  Impression and Recommendations:    1. 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  His job does require him to work with his hands on a regular basis and his pain is beginning to interfere.  Referring to occupational therapy to see if they may be able to find some interventions that will help reduce his discomfort. - Ambulatory referral to Occupational Therapy  Return if symptoms worsen or fail to improve. ___________________________________________ Thayer Ohm, DNP, APRN, FNP-BC Primary Care and Sports Medicine [facility]

## 2020-01-09 ENCOUNTER — Ambulatory Visit: Payer: BC Managed Care – PPO | Admitting: Rehabilitative and Restorative Service Providers"

## 2020-04-01 DIAGNOSIS — R059 Cough, unspecified: Secondary | ICD-10-CM | POA: Diagnosis not present

## 2020-04-01 DIAGNOSIS — F17210 Nicotine dependence, cigarettes, uncomplicated: Secondary | ICD-10-CM | POA: Diagnosis not present

## 2020-04-01 DIAGNOSIS — J029 Acute pharyngitis, unspecified: Secondary | ICD-10-CM | POA: Diagnosis not present

## 2020-04-01 DIAGNOSIS — R509 Fever, unspecified: Secondary | ICD-10-CM | POA: Diagnosis not present

## 2020-04-01 DIAGNOSIS — U071 COVID-19: Secondary | ICD-10-CM | POA: Diagnosis not present

## 2020-04-20 ENCOUNTER — Other Ambulatory Visit: Payer: Self-pay

## 2020-04-20 ENCOUNTER — Encounter: Payer: Self-pay | Admitting: Medical-Surgical

## 2020-04-20 ENCOUNTER — Ambulatory Visit (INDEPENDENT_AMBULATORY_CARE_PROVIDER_SITE_OTHER): Payer: BC Managed Care – PPO | Admitting: Medical-Surgical

## 2020-04-20 VITALS — BP 105/66 | HR 60 | Temp 98.8°F | Ht 65.25 in | Wt 154.8 lb

## 2020-04-20 DIAGNOSIS — M62838 Other muscle spasm: Secondary | ICD-10-CM | POA: Diagnosis not present

## 2020-04-20 MED ORDER — CYCLOBENZAPRINE HCL 10 MG PO TABS
10.0000 mg | ORAL_TABLET | Freq: Three times a day (TID) | ORAL | 0 refills | Status: DC | PRN
Start: 2020-04-20 — End: 2020-06-30

## 2020-04-20 MED ORDER — CELECOXIB 200 MG PO CAPS: 200.0000 mg | ORAL_CAPSULE | Freq: Two times a day (BID) | ORAL | 0 refills | Status: DC

## 2020-04-20 NOTE — Progress Notes (Signed)
  Subjective:    CC: back pain  HPI: Pleasant 45 year old male presenting with complaints of thoracic back pain.  Notes that he was Covid positive approximately 3 weeks ago with a moderate course of illness.  Currently, he has a lingering a.m. cough but no other Covid symptoms.  Since his illness, he has developed thoracic back pain that is constant with no alleviating factors identified.  Denies fever, chills, shortness of breath, anterior chest pain, and recent trauma.  Has not tried any interventions or over-the-counter medications.  I reviewed the past medical history, family history, social history, surgical history, and allergies today and no changes were needed.  Please see the problem list section below in epic for further details.  Past Medical History: Past Medical History:  Diagnosis Date  . Allergy   . Fatigue 12/02/2015  . Vitamin D deficiency 05/03/2017   Past Surgical History: Past Surgical History:  Procedure Laterality Date  . NO PAST SURGERIES     Social History: Social History   Socioeconomic History  . Marital status: Legally Separated    Spouse name: Not on file  . Number of children: Not on file  . Years of education: Not on file  . Highest education level: Not on file  Occupational History  . Not on file  Tobacco Use  . Smoking status: Never Smoker  . Smokeless tobacco: Never Used  Vaping Use  . Vaping Use: Never used  Substance and Sexual Activity  . Alcohol use: Yes  . Drug use: No  . Sexual activity: Not Currently    Partners: Female  Other Topics Concern  . Not on file  Social History Narrative  . Not on file   Social Determinants of Health   Financial Resource Strain: Not on file  Food Insecurity: Not on file  Transportation Needs: Not on file  Physical Activity: Not on file  Stress: Not on file  Social Connections: Not on file   Family History: Family History  Problem Relation Age of Onset  . Hyperlipidemia Mother   . Hypertension  Mother    Allergies: No Known Allergies Medications: See med rec.  Review of Systems: See HPI for pertinent positives and negatives.   Objective:    General: Well Developed, well nourished, and in no acute distress.  Neuro: Alert and oriented x3, extra-ocular muscles intact, sensation grossly intact.  HEENT: Normocephalic, atraumatic, pupils equal round reactive to light, neck supple, no masses, no lymphadenopathy, thyroid nonpalpable.  Skin: Warm and dry, no rashes. Cardiac: Regular rate and rhythm, no murmurs rubs or gallops, no lower extremity edema.  Respiratory: Clear to auscultation bilaterally. Not using accessory muscles, speaking in full sentences.  Impression and Recommendations:    1. Muscle spasm/costochondritis Celebrex 200 mg twice daily x14 days then 200 mg twice daily as needed.  Flexeril 10 mg 3 times daily as needed.  Advised patient to avoid taking Flexeril when he is at work or if he needs to operate machinery.  Recommend conservative measures such as heat, ice, Tylenol, and massage if desired.  Return if symptoms worsen or fail to improve. ___________________________________________ Thayer Ohm, DNP, APRN, FNP-BC Primary Care and Sports Medicine [facility]

## 2020-05-18 ENCOUNTER — Other Ambulatory Visit: Payer: Self-pay | Admitting: Medical-Surgical

## 2020-06-30 ENCOUNTER — Other Ambulatory Visit: Payer: Self-pay

## 2020-06-30 ENCOUNTER — Ambulatory Visit: Payer: BC Managed Care – PPO | Admitting: Sports Medicine

## 2020-06-30 ENCOUNTER — Ambulatory Visit (INDEPENDENT_AMBULATORY_CARE_PROVIDER_SITE_OTHER): Payer: BC Managed Care – PPO

## 2020-06-30 DIAGNOSIS — M545 Low back pain, unspecified: Secondary | ICD-10-CM | POA: Diagnosis not present

## 2020-06-30 DIAGNOSIS — M5136 Other intervertebral disc degeneration, lumbar region: Secondary | ICD-10-CM

## 2020-06-30 DIAGNOSIS — M51369 Other intervertebral disc degeneration, lumbar region without mention of lumbar back pain or lower extremity pain: Secondary | ICD-10-CM | POA: Insufficient documentation

## 2020-06-30 MED ORDER — PREDNISONE 50 MG PO TABS: ORAL_TABLET | ORAL | 0 refills | Status: DC

## 2020-06-30 MED ORDER — MELOXICAM 15 MG PO TABS: ORAL_TABLET | ORAL | 3 refills | Status: DC

## 2020-06-30 NOTE — Assessment & Plan Note (Signed)
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 Fortunately he now has had 3 months of pain in the low back worse with sitting, flexion, Valsalva, nothing radicular, no red flag symptoms. Adding 5 days of prednisone followed by meloxicam, formal PT, x-rays. Light duty at work if possible however we did discuss ergonomic positioning if light duty is not possible. Return to see me in 6 weeks, MRI for interventional planning if no better.

## 2020-06-30 NOTE — Progress Notes (Signed)
    Procedures performed today:    None.  Independent interpretation of notes and tests performed by another provider:   None.  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 Fortunately he now has had 3 months of pain in the low back worse with sitting, flexion, Valsalva, nothing radicular, no red flag symptoms. Adding 5 days of prednisone followed by meloxicam, formal PT, x-rays. Light duty at work if possible however we did discuss ergonomic positioning if light duty is not possible. Return to see me in 6 weeks, MRI for interventional planning if no better.    ___________________________________________ Ihor Austin. Benjamin Stain, M.D., ABFM., CAQSM. Primary Care and Sports Medicine [facility]  Adjunct Instructor of Family Medicine  [employer]

## 2020-07-06 ENCOUNTER — Encounter: Payer: Self-pay | Admitting: Rehabilitative and Restorative Service Providers"

## 2020-07-06 ENCOUNTER — Other Ambulatory Visit: Payer: Self-pay

## 2020-07-06 ENCOUNTER — Ambulatory Visit (INDEPENDENT_AMBULATORY_CARE_PROVIDER_SITE_OTHER): Payer: BC Managed Care – PPO | Admitting: Rehabilitative and Restorative Service Providers"

## 2020-07-06 DIAGNOSIS — R29898 Other symptoms and signs involving the musculoskeletal system: Secondary | ICD-10-CM

## 2020-07-06 DIAGNOSIS — M545 Low back pain, unspecified: Secondary | ICD-10-CM | POA: Diagnosis not present

## 2020-07-06 NOTE — Therapy (Addendum)
[facility] [address], [place], [address] Phone: [phone]   Fax:  [phone]  Physical Therapy Evaluation  Patient Details  Name: Victor Jimenez MRN: [id number] Date of Birth: January 19, 1976 Referring Provider (PT): Dr Dianah Field   Encounter Date: 07/06/2020   PT End of Session - 07/06/20 0924    Visit Number 1    Number of Visits 12    Date for PT Re-Evaluation 08/17/20    PT Start Time 0845    PT Stop Time 0925    PT Time Calculation (min) 40 min    Activity Tolerance Patient tolerated treatment well           Past Medical History:  Diagnosis Date  . Allergy   . Fatigue 12/02/2015  . Vitamin D deficiency 05/03/2017    Past Surgical History:  Procedure Laterality Date  . NO PAST SURGERIES      There were no vitals filed for this visit.    Subjective Assessment - 07/06/20 0851    Subjective Patient reports that he has had LBP for ~ 5-6 weeks related to lifting at work. He is feeling better with medication    Pertinent History no history of back pain    Patient Stated Goals get rid of pain and keep it from coming back    Currently in Pain? No/denies    Pain Score 0-No pain    Pain Location Back    Pain Orientation Left;Lower    Pain Descriptors / Indicators Aching   pinch   Pain Type Acute pain    Pain Onset More than a month ago    Pain Frequency Constant    Aggravating Factors  working; bending; lifting    Pain Relieving Factors medicine; ice; chiropractic care              [facility] PT Assessment - 07/06/20 0001      Assessment   Medical Diagnosis Lumbar dysfunction    Referring Provider (PT) Dr Dianah Field    Onset Date/Surgical Date 05/25/20    Hand Dominance Right    Next MD Visit 3 weeks    Prior Therapy chiropractic care      Precautions   Precautions None      Restrictions   Weight Bearing Restrictions No      Balance Screen   Has the patient fallen in the past 6  months No    Has the patient had a decrease in activity level because of a fear of falling?  No    Is the patient reluctant to leave their home because of a fear of falling?  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    Leisure gym 3-4 days/wk; soccer with son; household activities      Observation/Other Assessments   Focus on Therapeutic Outcomes (FOTO)  34      Sensation   Additional Comments WFL's per pt report      AROM   Lumbar Flexion 70% pulling    Lumbar Extension 40% stiffness    Lumbar - Right Side Bend 75%    Lumbar - Left Side Bend 75%    Lumbar - Right Rotation 55%    Lumbar - Left Rotation 55%      Strength   Overall Strength Comments 5/5 bilat LE's  Flexibility   Hamstrings tight Rt > Lt    Quadriceps tight bilat    ITB tight Rt > Lt    Piriformis WFL's      Palpation   Spinal mobility hypomobile lumbar L3/4/5    Palpation comment tight Lt hip flexors      Special Tests   Other special tests (-) slump; SLR                      Objective measurements completed on examination: See above findings.       [facility] PT Treatment/Exercise - 07/06/20 0001      Self-Care   Other Self-Care Comments  initiated back care education      Lumbar Exercises: Stretches   Passive Hamstring Stretch Left;2 reps;30 seconds    Hip Flexor Stretch Left;2 reps;30 seconds   seated   Prone on Elbows Stretch 1 rep;60 seconds    Press Ups 5 reps   2-3 sec hold   ITB Stretch Left;2 reps;30 seconds      Lumbar Exercises: Seated   Sit to Stand 5 reps    Sit to Stand Limitations hinged hip - needs additional work                  PT Education - 07/06/20 0915    Education Details HEP POC    Person(s) Educated Patient    Methods Explanation;Demonstration;Tactile cues;Verbal cues;Handout    Comprehension Verbalized  understanding;Returned demonstration;Verbal cues required;Tactile cues required               PT Long Term Goals - 07/06/20 0930      PT LONG TERM GOAL #1   Title Improve trunk extension to 70% of normal ROM    Time 6    Period Weeks    Status New    Target Date 08/17/20      PT LONG TERM GOAL #2   Title Patient reports decreased pain to no > than 0-2/10 with functional and work activities without medication    Time 6    Period Weeks    Status New    Target Date 08/17/20      PT LONG TERM GOAL #3   Title Independent in HEP including gym program    Time 6    Period Weeks    Status New    Target Date 08/17/20      PT LONG TERM GOAL #4   Title Improve functional limitation score to 61    Time 6    Period Weeks    Status New    Target Date 08/17/20                  Plan - 07/06/20 0925    Clinical Impression Statement Patient presents with 5-6 weeks history of LBP primarily on the Lt - no known injury 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 pain is improved with medcation. He continues to have intermittent Lt low back pain. Patient has limited trunk and LE mobility/ROM; pain and limitation with spring testing lower lumbar spine; muscular tightness Lt hip flexors; poor posture sitting and with movement. Patient will benefit from PT to address problems identified.    Stability/Clinical Decision Making Stable/Uncomplicated    Clinical Decision Making Low    Rehab Potential Good    PT Frequency 2x / week    PT Duration 6 weeks    PT Treatment/Interventions  ADLs/Self Care Home Management;Aquatic Therapy;Cryotherapy;Electrical Stimulation;Iontophoresis 40m/ml Dexamethasone;Moist Heat;Traction;Functional mobility training;Therapeutic activities;Therapeutic exercise;Neuromuscular re-education;Patient/family education;Manual techniques;Dry needling;Taping    PT Next Visit Plan review HEP; education re- lifting/body  mechanics; sitting posture; add core stabilization and strengthening; gym and lifting eduction    PT [place] Agree with Plan of Care Patient           Patient will benefit from skilled therapeutic intervention in order to improve the following deficits and impairments:  Pain,Hypomobility,Impaired flexibility,Improper body mechanics,Decreased mobility,Postural dysfunction  Visit Diagnosis: Acute left-sided low back pain without sciatica  Other symptoms and signs involving the musculoskeletal system     Problem List Patient Active Problem List   Diagnosis Date Noted  . DDD (degenerative disc disease), lumbar 06/30/2020  . Pain of right middle finger 10/14/2019  . Carpal tunnel syndrome 08/14/2018  . Vitamin D deficiency 05/03/2017  . Medial epicondylitis of elbow, left 05/03/2017  . Fatigue 12/02/2015  . Insomnia 12/02/2015  . Helicobacter pylori antibody positive 01/23/2011    Glynn Yepes PNilda SimmerPT, MPH  07/06/2020, 10:16 AM  [facility] [place] [address]Phone: [phone]  Fax:  [phone] Name: MLuther SpringsMRN: [id number] of Birth: [birth year] PHYSICAL THERAPY DISCHARGE SUMMARY  Visits from Start of Care: Evaluation only   Current functional level related to goals / functional outcomes: See evaluation for discharge status    Remaining deficits: Unknown    Education / Equipment: Initial HEP  Plan: Patient agrees to discharge.  Patient goals were not met. Patient is being discharged due to not returning since the last visit.  ?????    Braeden Dolinski P. HHelene KelpPT, MPH 08/26/20 2:26 PM

## 2020-07-06 NOTE — Patient Instructions (Addendum)
Sleeping on Back  Place pillow under knees. A pillow with cervical support and a roll around waist are also helpful. Copyright  VHI. All rights reserved.  Sleeping on Side Place pillow between knees. Use cervical support under neck and a roll around waist as needed. Copyright  VHI. All rights reserved.   Sleeping on Stomach   If this is the only desirable sleeping position, place pillow under lower legs, and under stomach or chest as needed.  Posture - Sitting   Sit upright, head facing forward. Try using a roll to support lower back. Keep shoulders relaxed, and avoid rounded back. Keep hips level with knees. Avoid crossing legs for long periods. Stand to Sit / Sit to Stand   To sit: Bend knees to lower self onto front edge of chair, then scoot back on seat. To stand: Reverse sequence by placing one foot forward, and scoot to front of seat. Use rocking motion to stand up.   Work Height and Reach  Ideal work height is no more than 2 to 4 inches below elbow level when standing, and at elbow level when sitting. Reaching should be limited to arm's length, with elbows slightly bent.  Bending  Bend at hips and knees, not back. Keep feet shoulder-width apart.    Posture - Standing   Good posture is important. Avoid slouching and forward head thrust. Maintain curve in low back and align ears over shoul- ders, hips over ankles.  Alternating Positions   Alternate tasks and change positions frequently to reduce fatigue and muscle tension. Take rest breaks. Computer Work   Position work to Programmer, multimedia. Use proper work and seat height. Keep shoulders back and down, wrists straight, and elbows at right angles. Use chair that provides full back support. Add footrest and lumbar roll as needed.  Getting Into / Out of Car  Lower self onto seat, scoot back, then bring in one leg at a time. Reverse sequence to get out.  Dressing  Lie on back to pull socks or slacks over feet, or  sit and bend leg while keeping back straight.    Housework - Sink  Place one foot on ledge of cabinet under sink when standing at sink for prolonged periods.   Pushing / Pulling  Pushing is preferable to pulling. Keep back in proper alignment, and use leg muscles to do the work.  Deep Squat   Squat and lift with both arms held against upper trunk. Tighten stomach muscles without holding breath. Use smooth movements to avoid jerking.  Avoid Twisting   Avoid twisting or bending back. Pivot around using foot movements, and bend at knees if needed when reaching for articles.  Carrying Luggage   Distribute weight evenly on both sides. Use a cart whenever possible. Do not twist trunk. Move body as a unit.   Lifting Principles .Maintain proper posture and head alignment. .Slide object as close as possible before lifting. .Move obstacles out of the way. .Test before lifting; ask for help if too heavy. .Tighten stomach muscles without holding breath. .Use smooth movements; do not jerk. .Use legs to do the work, and pivot with feet. .Distribute the work load symmetrically and close to the center of trunk. .Push instead of pull whenever possible.   Ask For Help   Ask for help and delegate to others when possible. Coordinate your movements when lifting together, and maintain the low back curve.  Log Roll   Lying on back, bend left knee and place left  arm across chest. Roll all in one movement to the right. Reverse to roll to the left. Always move as one unit. Housework - Sweeping  Use long-handled equipment to avoid stooping.   Housework - Wiping  Position yourself as close as possible to reach work surface. Avoid straining your back.  Laundry - Unloading Wash   To unload small items at bottom of washer, lift leg opposite to arm being used to reach.  Gardening - Raking  Move close to area to be raked. Use arm movements to do the work. Keep back straight and avoid  twisting.     Cart  When reaching into cart with one arm, lift opposite leg to keep back straight.   Getting Into / Out of Bed  Lower self to lie down on one side by raising legs and lowering head at the same time. Use arms to assist moving without twisting. Bend both knees to roll onto back if desired. To sit up, start from lying on side, and use same move-ments in reverse. Housework - Vacuuming  Hold the vacuum with arm held at side. Step back and forth to move it, keeping head up. Avoid twisting.   Laundry - Armed forces training and education officer so that bending and twisting can be avoided.   Laundry - Unloading Dryer  Squat down to reach into clothes dryer or use a reacher.  Gardening - Weeding / Psychiatric nurse or Kneel. Knee pads may be helpful.                  Access Code: [email or web address]: [email or web address]: 04/12/2022Prepared by: Yaretsi Humphres HoltExercises  Prone Press Up - 2 x daily - 7 x weekly - 1 sets - 10 reps - 2-3 sec hold  Prone Press Up On Elbows - 2 x daily - 7 x weekly - 1 sets - 3 reps - 30 sec hold  Hooklying Hamstring Stretch with Strap - 2 x daily - 7 x weekly - 1 sets - 3 reps - 30 sec hold  Supine ITB Stretch with Strap - 2 x daily - 7 x weekly - 1 sets - 3 reps - 30 sec hold  Sit to Stand - 2 x daily - 7 x weekly - 1 sets - 10 reps - 3-5 sec hold

## 2020-07-13 ENCOUNTER — Encounter: Payer: BC Managed Care – PPO | Admitting: Physical Therapy

## 2020-08-11 DIAGNOSIS — H524 Presbyopia: Secondary | ICD-10-CM | POA: Diagnosis not present

## 2020-11-22 ENCOUNTER — Emergency Department
Admission: EM | Admit: 2020-11-22 | Discharge: 2020-11-22 | Disposition: A | Discharge status: Home / Self Care | Payer: BC Managed Care – PPO | Source: Home / Self Care | Attending: Family Medicine | Admitting: Family Medicine

## 2020-11-22 ENCOUNTER — Encounter: Payer: Self-pay | Admitting: *Deleted

## 2020-11-22 ENCOUNTER — Emergency Department (INDEPENDENT_AMBULATORY_CARE_PROVIDER_SITE_OTHER): Payer: BC Managed Care – PPO

## 2020-11-22 DIAGNOSIS — M25551 Pain in right hip: Secondary | ICD-10-CM | POA: Diagnosis not present

## 2020-11-22 MED ORDER — PREDNISONE 20 MG PO TABS: 20.0000 mg | ORAL_TABLET | Freq: Two times a day (BID) | ORAL | 0 refills | Status: DC

## 2020-11-22 MED ORDER — MELOXICAM 15 MG PO TABS: 15.0000 mg | ORAL_TABLET | Freq: Every day | ORAL | 0 refills | Status: DC

## 2020-11-22 NOTE — ED Provider Notes (Signed)
[facility]    CSN: [id number] Arrival date & time: 11/22/20  1104      History   Chief Complaint No chief complaint on file.   HPI Jaimon Bugaj is a 45 y.o. male.   HPI Patient states that he played soccer yesterday.  He states that he completed the game without any difficulty although he felt a slight "pull" in the front of his right hip.  This is worsened and now he has pain in his hip with every step up weightbearing.  Some pain with movement.  No radiation of pain, he said it slightly goes into the upper thigh region.  No prior history of hip problems.  No fall or trauma reported.  Past Medical History:  Diagnosis Date   Allergy    Fatigue 12/02/2015   Vitamin D deficiency 05/03/2017    Patient Active Problem List   Diagnosis Date Noted   DDD (degenerative disc disease), lumbar 06/30/2020   Pain of right middle finger 10/14/2019   Carpal tunnel syndrome 08/14/2018   Vitamin D deficiency 05/03/2017   Medial epicondylitis of elbow, left 05/03/2017   Fatigue 12/02/2015   Insomnia 12/02/2015   Helicobacter pylori antibody positive 01/23/2011    Past Surgical History:  Procedure Laterality Date   NO PAST SURGERIES         Home Medications    Prior to Admission medications   Medication Sig Start Date End Date Taking? Authorizing Provider  meloxicam (MOBIC) 15 MG tablet Take 1 tablet (15 mg total) by mouth daily. 11/22/20  Yes Eustace Moore, MD  predniSONE (DELTASONE) 20 MG tablet Take 1 tablet (20 mg total) by mouth 2 (two) times daily with a meal. 11/22/20  Yes Eustace Moore, MD    Family History Family History  Problem Relation Age of Onset   Hyperlipidemia Mother    Hypertension Mother     Social History Social History   Tobacco Use   Smoking status: Never   Smokeless tobacco: Never  Vaping Use   Vaping Use: Never used  Substance Use Topics   Alcohol use: Yes   Drug use: No     Allergies   Patient has no known  allergies.   Review of Systems Review of Systems See HPI  Physical Exam Triage Vital Signs ED Triage Vitals  Enc Vitals Group     BP 11/22/20 1119 129/81     Pulse Rate 11/22/20 1119 79     Resp 11/22/20 1119 16     Temp 11/22/20 1119 98 F (36.7 C)     Temp Source 11/22/20 1119 Oral     SpO2 11/22/20 1119 98 %     Weight --      Height --      Head Circumference --      Peak Flow --      Pain Score 11/22/20 1121 7     Pain Loc --      Pain Edu? --      Excl. in GC? --    No data found.  Updated Vital Signs BP 129/81 (BP Location: Right Arm)   Pulse 79   Temp 98 F (36.7 C) (Oral)   Resp 16   SpO2 98%      Physical Exam Constitutional:      General: He is not in acute distress.    Appearance: He is well-developed.  HENT:     Head: Normocephalic and atraumatic.     Mouth/Throat:  Comments: Mask is in place Eyes:     Conjunctiva/sclera: Conjunctivae normal.     Pupils: Pupils are equal, round, and reactive to light.  Cardiovascular:     Rate and Rhythm: Normal rate.  Pulmonary:     Effort: Pulmonary effort is normal. No respiratory distress.  Abdominal:     General: There is no distension.     Palpations: Abdomen is soft.  Musculoskeletal:        General: Normal range of motion.     Cervical back: Normal range of motion.     Comments: Antalgic gait.  Resist putting full weight on right leg.  Right hip has full range of motion.  No swelling or deformity.  No tenderness over the greater trochanter or ischial tuberosity.  There is tenderness anteriorly over the hip joint.  Skin:    General: Skin is warm and dry.  Neurological:     Mental Status: He is alert.     Gait: Gait abnormal.     UC Treatments / Results  Labs (all labs ordered are listed, but only abnormal results are displayed) Labs Reviewed - No data to display  EKG   Radiology DG Hip Unilat W or Wo Pelvis 2-3 Views Right  Result Date: 11/22/2020 CLINICAL DATA:  Right anterior hip  pain since playing soccer yesterday EXAM: DG HIP (WITH OR WITHOUT PELVIS) 2-3V RIGHT COMPARISON:  None. FINDINGS: There is no evidence of hip fracture or dislocation. There is no evidence of arthropathy or other focal bone abnormality. IMPRESSION: Negative. Electronically Signed   By: Judie Petit.  Shick M.D.   On: 11/22/2020 12:01    Procedures Procedures (including critical care time)  Medications Ordered in UC Medications - No data to display  Initial Impression / Assessment and Plan / UC Course  I have reviewed the triage vital signs and the nursing notes.  Pertinent labs & imaging results that were available during my care of the patient were reviewed by me and considered in my medical decision making (see chart for details).     Patient has had pain after athletic event probably from overuse or repetitive motions.  There was no trauma.  He is not have problems with his hip before.  X-rays are reassuring.  We will give him anti-inflammatories and he will rest for a bit, he knows to see his sports medicine doctor if he fails to improve Final Clinical Impressions(s) / UC Diagnoses   Final diagnoses:  Acute right hip pain     Discharge Instructions      Your hip x-ray is normal I feel like the joint is inflamed from repetitive movement playing soccer. Take prednisone 2 times a day for 5 days.  Take 2 doses today.  This will help reduce the pain and inflammation After the prednisone take meloxicam once a day until your pain is gone.  Meloxicam is also an anti-inflammatory drug.  Take both of these medicines with food See Dr. Karie Schwalbe if not better by the end of the week     ED Prescriptions     Medication Sig Dispense Auth. Provider   predniSONE (DELTASONE) 20 MG tablet Take 1 tablet (20 mg total) by mouth 2 (two) times daily with a meal. 10 tablet Eustace Moore, MD   meloxicam (MOBIC) 15 MG tablet Take 1 tablet (15 mg total) by mouth daily. 30 tablet Eustace Moore, MD      PDMP  not reviewed this encounter.   Eustace Moore, MD  11/22/20 1334  

## 2020-11-22 NOTE — Discharge Instructions (Addendum)
Your hip x-ray is normal I feel like the joint is inflamed from repetitive movement playing soccer. Take prednisone 2 times a day for 5 days.  Take 2 doses today.  This will help reduce the pain and inflammation After the prednisone take meloxicam once a day until your pain is gone.  Meloxicam is also an anti-inflammatory drug.  Take both of these medicines with food See Dr. Karie Schwalbe if not better by the end of the week

## 2020-11-22 NOTE — ED Triage Notes (Signed)
Patient reports developing right hip pain while playing soccer yesterday. No known injury. Pain is severe with walking. No otc meds taken.

## 2021-02-04 ENCOUNTER — Other Ambulatory Visit: Payer: Self-pay | Admitting: Medical-Surgical

## 2021-02-04 DIAGNOSIS — Z1329 Encounter for screening for other suspected endocrine disorder: Secondary | ICD-10-CM

## 2021-02-04 DIAGNOSIS — Z0000 Encounter for general adult medical examination without abnormal findings: Secondary | ICD-10-CM

## 2021-02-04 DIAGNOSIS — Z131 Encounter for screening for diabetes mellitus: Secondary | ICD-10-CM

## 2021-03-22 ENCOUNTER — Ambulatory Visit (INDEPENDENT_AMBULATORY_CARE_PROVIDER_SITE_OTHER): Payer: Self-pay | Admitting: Medical-Surgical

## 2021-03-22 DIAGNOSIS — Z91199 Patient's noncompliance with other medical treatment and regimen due to unspecified reason: Secondary | ICD-10-CM

## 2021-03-22 NOTE — Progress Notes (Signed)
   Complete physical exam  Patient: Victor Jimenez   DOB: 01/14/1997   45 y.o. Male  MRN: [id number]  Subjective:    No chief complaint on file.   Victor Jimenez is a 45 y.o. male who presents today for a complete physical exam. She reports consuming a {diet types:17450} diet. {types:19826} She generally feels {DESC; WELL/FAIRLY WELL/POORLY:18703}. She reports sleeping {DESC; WELL/FAIRLY WELL/POORLY:18703}. She {does/does not:200015} have additional problems to discuss today.    Most recent fall risk assessment:    09/21/2021   10:42 AM  Fall Risk   Falls in the past year? 0  Number falls in past yr: 0  Injury with Fall? 0  Risk for fall due to : No Fall Risks  Follow up Falls evaluation completed     Most recent depression screenings:    09/21/2021   10:42 AM 08/12/2020   10:46 AM  PHQ 2/9 Scores  PHQ - 2 Score 0 0  PHQ- 9 Score 5     {VISON DENTAL STD PSA (Optional):27386}  {History (Optional):23778}  Patient Care Team: Gracin Soohoo, NP as PCP - General (Nurse Practitioner)   Outpatient Medications Prior to Visit  Medication Sig   fluticasone (FLONASE) 50 MCG/ACT nasal spray Place 2 sprays into both nostrils in the morning and at bedtime. After 7 days, reduce to once daily.   norgestimate-ethinyl estradiol (SPRINTEC 28) 0.25-35 MG-MCG tablet Take 1 tablet by mouth daily.   Nystatin POWD Apply liberally to affected area 2 times per day   spironolactone (ALDACTONE) 100 MG tablet Take 1 tablet (100 mg total) by mouth daily.   No facility-administered medications prior to visit.    ROS        Objective:     There were no vitals taken for this visit. {Vitals History (Optional):23777}  Physical Exam   No results found for any visits on 10/27/21. {Show previous labs (optional):23779}    Assessment & Plan:    Routine Health Maintenance and Physical Exam  Immunization History  Administered Date(s) Administered   DTaP 03/30/1999, 05/26/1999,  08/04/1999, 04/19/2000, 11/03/2003   Hepatitis A 08/30/2007, 09/04/2008   Hepatitis B 01/15/1999, 02/22/1999, 08/04/1999   HiB (PRP-OMP) 03/30/1999, 05/26/1999, 08/04/1999, 04/19/2000   IPV 03/30/1999, 05/26/1999, 01/23/2000, 11/03/2003   Influenza,inj,Quad PF,6+ Mos 12/05/2013   Influenza-Unspecified 03/06/2012   MMR 01/22/2001, 11/03/2003   Meningococcal Polysaccharide 09/04/2011   Pneumococcal Conjugate-13 04/19/2000   Pneumococcal-Unspecified 08/04/1999, 10/18/1999   Tdap 09/04/2011   Varicella 01/23/2000, 08/30/2007    Health Maintenance  Topic Date Due   HIV Screening  Never done   Hepatitis C Screening  Never done   INFLUENZA VACCINE  10/25/2021   PAP-Cervical Cytology Screening  10/27/2021 (Originally 01/14/2020)   PAP SMEAR-Modifier  10/27/2021 (Originally 01/14/2020)   TETANUS/TDAP  10/27/2021 (Originally 09/03/2021)   HPV VACCINES  Discontinued   COVID-19 Vaccine  Discontinued    Discussed health benefits of physical activity, and encouraged her to engage in regular exercise appropriate for her age and condition.  Problem List Items Addressed This Visit   None Visit Diagnoses     Annual physical exam    -  Primary   Cervical cancer screening       Need for Tdap vaccination          No follow-ups on file.     Burnetta Kohls, NP   

## 2021-03-22 NOTE — Patient Instructions (Signed)
   Complete physical exam  Patient: Victor Jimenez   DOB: 01/14/1997   45 y.o. Male  MRN: [id number]  Subjective:    No chief complaint on file.   Victor Jimenez is a 45 y.o. male who presents today for a complete physical exam. She reports consuming a {diet types:17450} diet. {types:19826} She generally feels {DESC; WELL/FAIRLY WELL/POORLY:18703}. She reports sleeping {DESC; WELL/FAIRLY WELL/POORLY:18703}. She {does/does not:200015} have additional problems to discuss today.    Most recent fall risk assessment:    09/21/2021   10:42 AM  Fall Risk   Falls in the past year? 0  Number falls in past yr: 0  Injury with Fall? 0  Risk for fall due to : No Fall Risks  Follow up Falls evaluation completed     Most recent depression screenings:    09/21/2021   10:42 AM 08/12/2020   10:46 AM  PHQ 2/9 Scores  PHQ - 2 Score 0 0  PHQ- 9 Score 5     {VISON DENTAL STD PSA (Optional):27386}  {History (Optional):23778}  Patient Care Team: Kamaya Keckler, NP as PCP - General (Nurse Practitioner)   Outpatient Medications Prior to Visit  Medication Sig   fluticasone (FLONASE) 50 MCG/ACT nasal spray Place 2 sprays into both nostrils in the morning and at bedtime. After 7 days, reduce to once daily.   norgestimate-ethinyl estradiol (SPRINTEC 28) 0.25-35 MG-MCG tablet Take 1 tablet by mouth daily.   Nystatin POWD Apply liberally to affected area 2 times per day   spironolactone (ALDACTONE) 100 MG tablet Take 1 tablet (100 mg total) by mouth daily.   No facility-administered medications prior to visit.    ROS        Objective:     There were no vitals taken for this visit. {Vitals History (Optional):23777}  Physical Exam   No results found for any visits on 10/27/21. {Show previous labs (optional):23779}    Assessment & Plan:    Routine Health Maintenance and Physical Exam  Immunization History  Administered Date(s) Administered   DTaP 03/30/1999, 05/26/1999,  08/04/1999, 04/19/2000, 11/03/2003   Hepatitis A 08/30/2007, 09/04/2008   Hepatitis B 01/15/1999, 02/22/1999, 08/04/1999   HiB (PRP-OMP) 03/30/1999, 05/26/1999, 08/04/1999, 04/19/2000   IPV 03/30/1999, 05/26/1999, 01/23/2000, 11/03/2003   Influenza,inj,Quad PF,6+ Mos 12/05/2013   Influenza-Unspecified 03/06/2012   MMR 01/22/2001, 11/03/2003   Meningococcal Polysaccharide 09/04/2011   Pneumococcal Conjugate-13 04/19/2000   Pneumococcal-Unspecified 08/04/1999, 10/18/1999   Tdap 09/04/2011   Varicella 01/23/2000, 08/30/2007    Health Maintenance  Topic Date Due   HIV Screening  Never done   Hepatitis C Screening  Never done   INFLUENZA VACCINE  10/25/2021   PAP-Cervical Cytology Screening  10/27/2021 (Originally 01/14/2020)   PAP SMEAR-Modifier  10/27/2021 (Originally 01/14/2020)   TETANUS/TDAP  10/27/2021 (Originally 09/03/2021)   HPV VACCINES  Discontinued   COVID-19 Vaccine  Discontinued    Discussed health benefits of physical activity, and encouraged her to engage in regular exercise appropriate for her age and condition.  Problem List Items Addressed This Visit   None Visit Diagnoses     Annual physical exam    -  Primary   Cervical cancer screening       Need for Tdap vaccination          No follow-ups on file.     Jonathan Kirkendoll, NP   

## 2021-05-31 ENCOUNTER — Encounter: Payer: Self-pay | Admitting: Medical-Surgical

## 2021-05-31 ENCOUNTER — Ambulatory Visit (INDEPENDENT_AMBULATORY_CARE_PROVIDER_SITE_OTHER): Payer: Self-pay | Admitting: Medical-Surgical

## 2021-05-31 DIAGNOSIS — Z91199 Patient's noncompliance with other medical treatment and regimen due to unspecified reason: Secondary | ICD-10-CM

## 2021-05-31 NOTE — Patient Instructions (Signed)
Erroneous encounter. No show for appointment. ?

## 2021-05-31 NOTE — Progress Notes (Signed)
   Complete physical exam  Patient: Victor Jimenez   DOB: 01/14/1997   46 y.o. Male  MRN: [id number]  Subjective:    No chief complaint on file.   Victor Jimenez is a 46 y.o. male who presents today for a complete physical exam. She reports consuming a {diet types:17450} diet. {types:19826} She generally feels {DESC; WELL/FAIRLY WELL/POORLY:18703}. She reports sleeping {DESC; WELL/FAIRLY WELL/POORLY:18703}. She {does/does not:200015} have additional problems to discuss today.    Most recent fall risk assessment:    09/21/2021   10:42 AM  Fall Risk   Falls in the past year? 0  Number falls in past yr: 0  Injury with Fall? 0  Risk for fall due to : No Fall Risks  Follow up Falls evaluation completed     Most recent depression screenings:    09/21/2021   10:42 AM 08/12/2020   10:46 AM  PHQ 2/9 Scores  PHQ - 2 Score 0 0  PHQ- 9 Score 5     {VISON DENTAL STD PSA (Optional):27386}  {History (Optional):23778}  Patient Care Team: Danyela Posas, NP as PCP - General (Nurse Practitioner)   Outpatient Medications Prior to Visit  Medication Sig   fluticasone (FLONASE) 50 MCG/ACT nasal spray Place 2 sprays into both nostrils in the morning and at bedtime. After 7 days, reduce to once daily.   norgestimate-ethinyl estradiol (SPRINTEC 28) 0.25-35 MG-MCG tablet Take 1 tablet by mouth daily.   Nystatin POWD Apply liberally to affected area 2 times per day   spironolactone (ALDACTONE) 100 MG tablet Take 1 tablet (100 mg total) by mouth daily.   No facility-administered medications prior to visit.    ROS        Objective:     There were no vitals taken for this visit. {Vitals History (Optional):23777}  Physical Exam   No results found for any visits on 10/27/21. {Show previous labs (optional):23779}    Assessment & Plan:    Routine Health Maintenance and Physical Exam  Immunization History  Administered Date(s) Administered   DTaP 03/30/1999, 05/26/1999,  08/04/1999, 04/19/2000, 11/03/2003   Hepatitis A 08/30/2007, 09/04/2008   Hepatitis B 01/15/1999, 02/22/1999, 08/04/1999   HiB (PRP-OMP) 03/30/1999, 05/26/1999, 08/04/1999, 04/19/2000   IPV 03/30/1999, 05/26/1999, 01/23/2000, 11/03/2003   Influenza,inj,Quad PF,6+ Mos 12/05/2013   Influenza-Unspecified 03/06/2012   MMR 01/22/2001, 11/03/2003   Meningococcal Polysaccharide 09/04/2011   Pneumococcal Conjugate-13 04/19/2000   Pneumococcal-Unspecified 08/04/1999, 10/18/1999   Tdap 09/04/2011   Varicella 01/23/2000, 08/30/2007    Health Maintenance  Topic Date Due   HIV Screening  Never done   Hepatitis C Screening  Never done   INFLUENZA VACCINE  10/25/2021   PAP-Cervical Cytology Screening  10/27/2021 (Originally 01/14/2020)   PAP SMEAR-Modifier  10/27/2021 (Originally 01/14/2020)   TETANUS/TDAP  10/27/2021 (Originally 09/03/2021)   HPV VACCINES  Discontinued   COVID-19 Vaccine  Discontinued    Discussed health benefits of physical activity, and encouraged her to engage in regular exercise appropriate for her age and condition.  Problem List Items Addressed This Visit   None Visit Diagnoses     Annual physical exam    -  Primary   Cervical cancer screening       Need for Tdap vaccination          No follow-ups on file.     Demarie Uhlig, NP   

## 2021-06-23 DIAGNOSIS — W208XXA Other cause of strike by thrown, projected or falling object, initial encounter: Secondary | ICD-10-CM | POA: Diagnosis not present

## 2021-06-23 DIAGNOSIS — S62662A Nondisplaced fracture of distal phalanx of right middle finger, initial encounter for closed fracture: Secondary | ICD-10-CM | POA: Diagnosis not present

## 2021-06-23 DIAGNOSIS — S60131A Contusion of right middle finger with damage to nail, initial encounter: Secondary | ICD-10-CM | POA: Diagnosis not present

## 2021-06-23 DIAGNOSIS — S62632A Displaced fracture of distal phalanx of right middle finger, initial encounter for closed fracture: Secondary | ICD-10-CM | POA: Diagnosis not present

## 2021-06-23 DIAGNOSIS — M79644 Pain in right finger(s): Secondary | ICD-10-CM | POA: Diagnosis not present

## 2021-09-21 ENCOUNTER — Telehealth: Payer: Self-pay | Admitting: Medical-Surgical

## 2021-09-21 DIAGNOSIS — Z0000 Encounter for general adult medical examination without abnormal findings: Secondary | ICD-10-CM

## 2021-09-21 NOTE — Telephone Encounter (Signed)
Pt called.  He has scheduled his physical in August and would like lab order put in before his appointment.

## 2021-09-21 NOTE — Telephone Encounter (Signed)
Lab orders placed.  

## 2021-11-01 IMAGING — DX DG LUMBAR SPINE COMPLETE 4+V
5 series · 5 of 5 positions shown · non-contrast
Comparison: None.

CLINICAL DATA: Lower back pain for 2 months without known injury.

EXAM:
LUMBAR SPINE - COMPLETE 4+ VIEW

[l-spine ap]
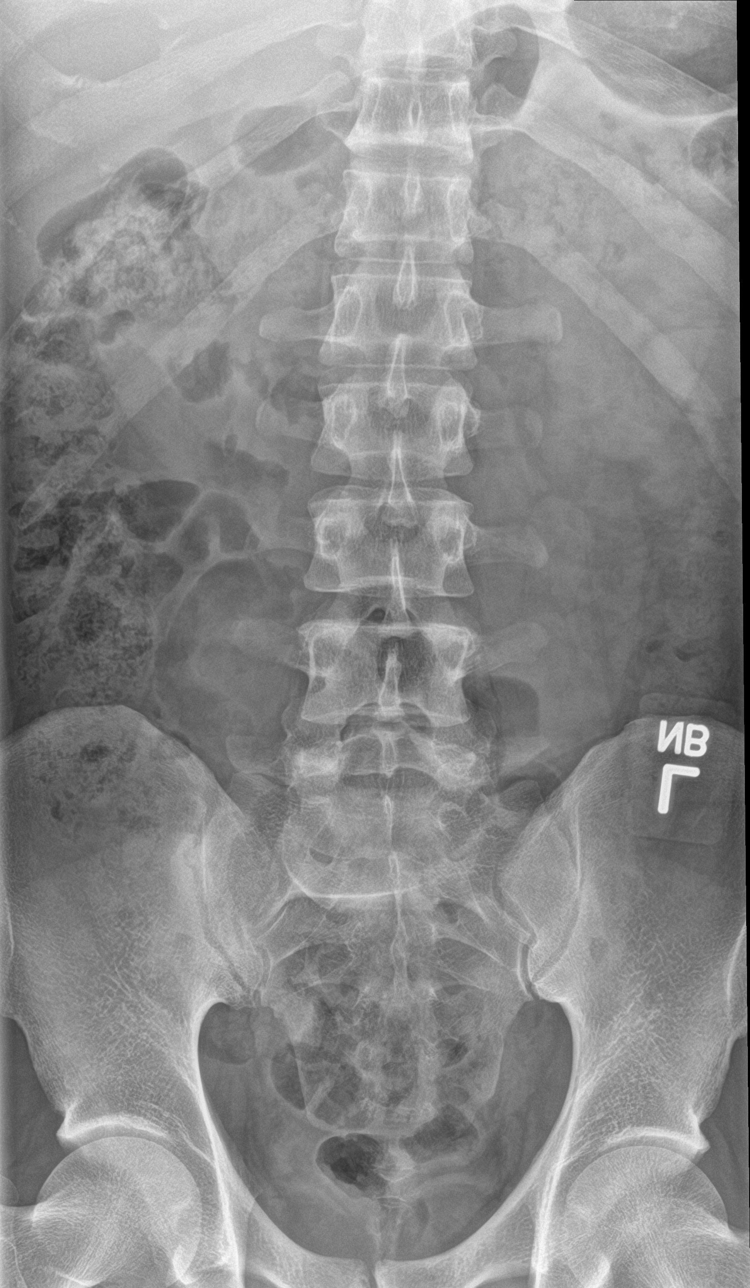

[l-spine obl (1 of 2)]
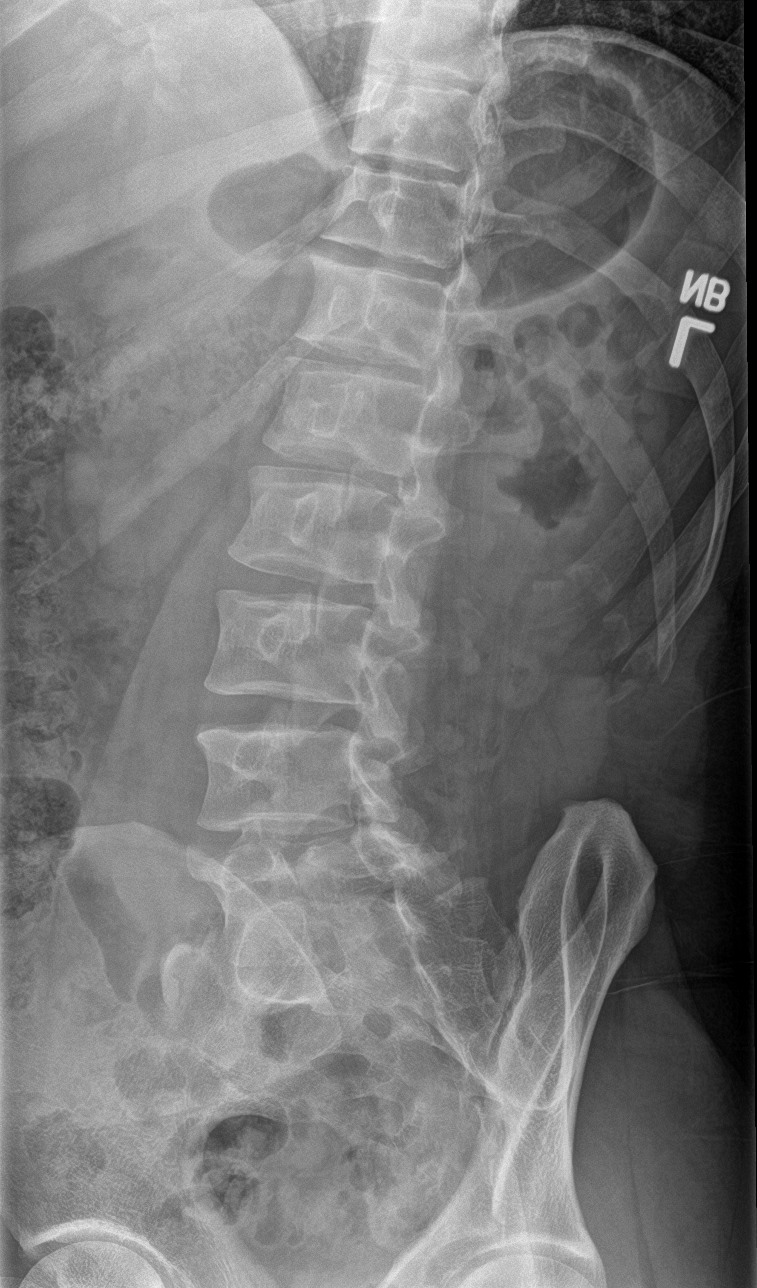

[l-spine obl (2 of 2)]
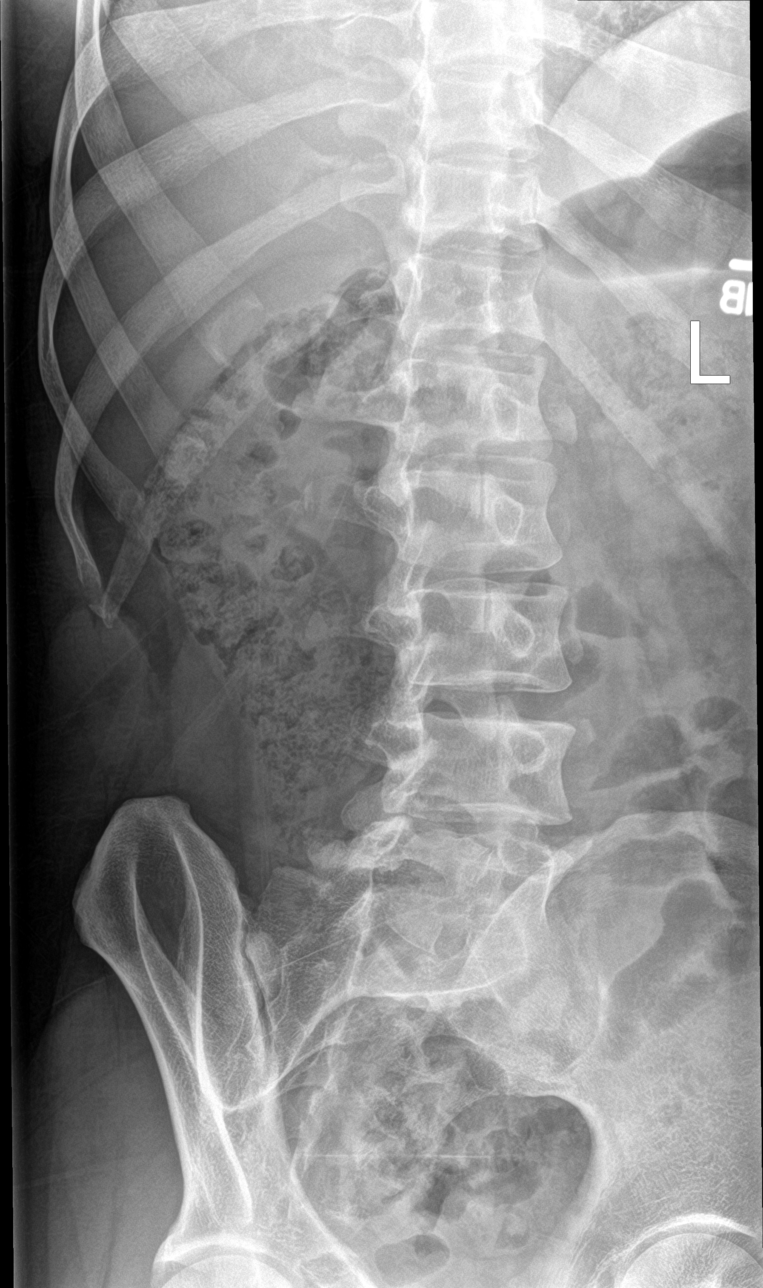

[l-spine lat]
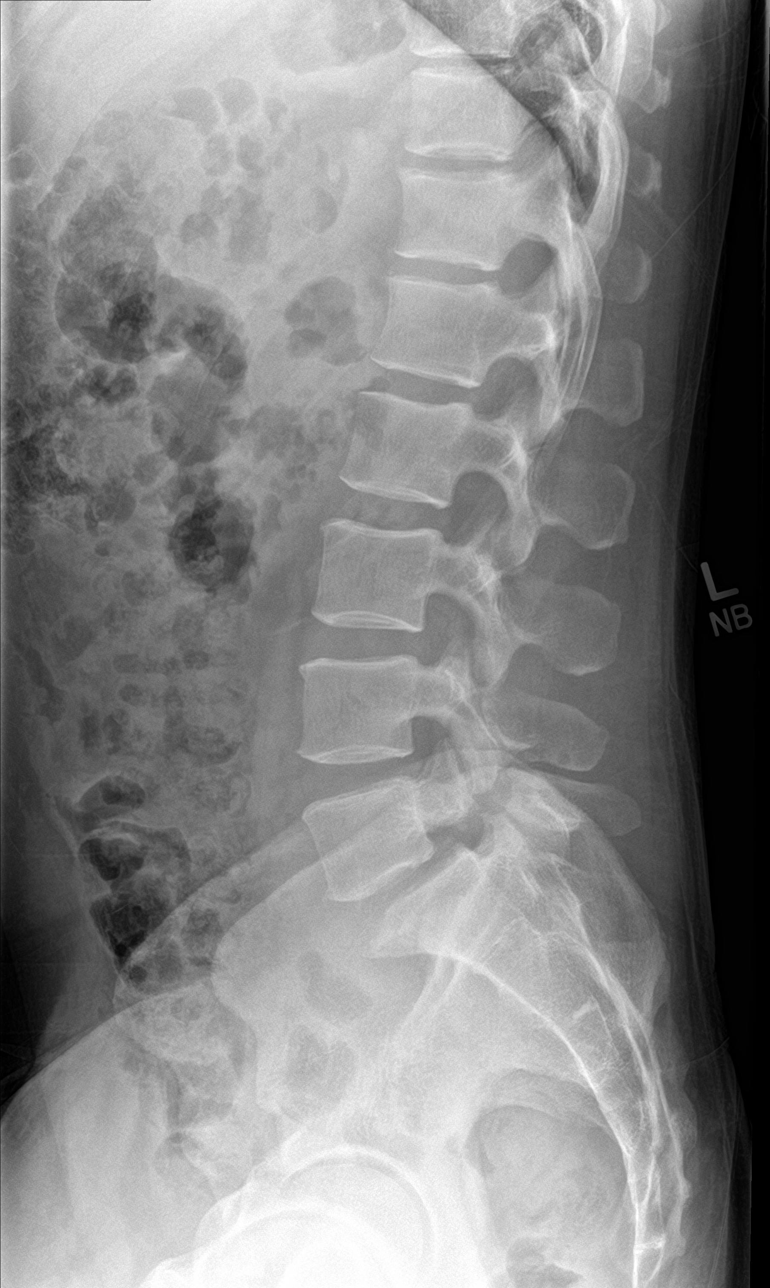

[l-spine spot]
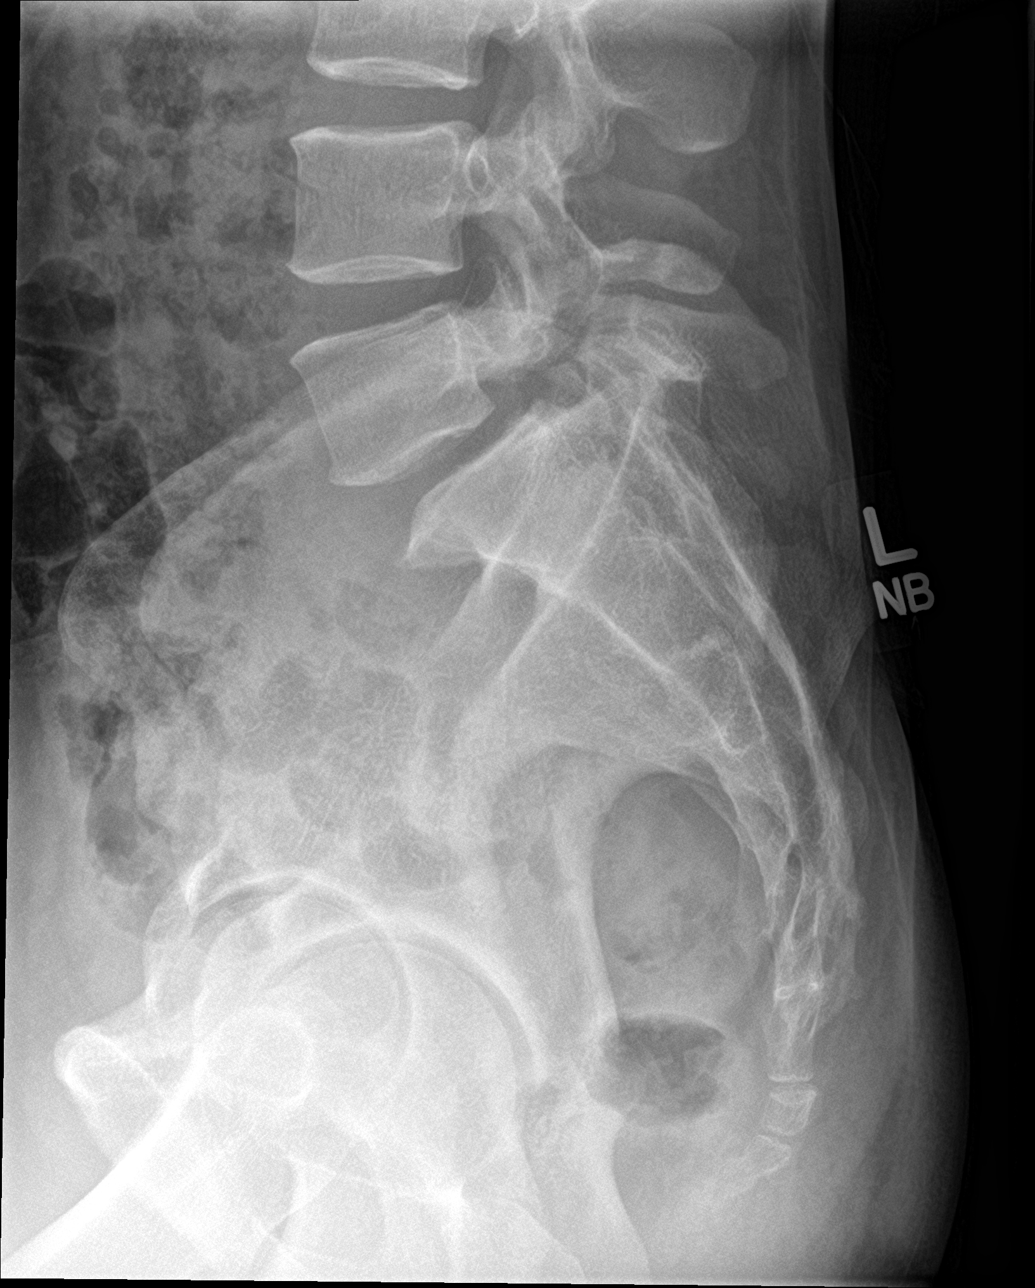

[5 of 5 positions shown; findings below may reference images not displayed]

FINDINGS: Grade 1 anterolisthesis of L5-S1 is noted secondary to bilateral L5
spondylolysis. No acute fracture is noted. Disc spaces are
well-maintained.
IMPRESSION: Grade 1 anterolisthesis of L5-S1 secondary to bilateral L5
spondylolysis.

## 2021-11-09 NOTE — Progress Notes (Unsigned)
   Complete physical exam  Patient: Victor Jimenez   DOB: 07/25/1975   46 y.o. Male  MRN: [id number]  Subjective:    No chief complaint on file.   Victor Jimenez is a 46 y.o. male who presents today for a complete physical exam. He reports consuming a {diet types:17450} diet. {types:19826} He generally feels {DESC; WELL/FAIRLY WELL/POORLY:18703}. He reports sleeping {DESC; WELL/FAIRLY WELL/POORLY:18703}. He {does/does not:200015} have additional problems to discuss today.    Most recent fall risk assessment:    01/07/2020    8:15 AM  Fall Risk   Falls in the past year? 0  Number falls in past yr: 0  Injury with Fall? 0  Follow up Falls evaluation completed     Most recent depression screenings:    01/07/2020    8:15 AM 10/02/2019    8:38 AM  PHQ 2/9 Scores  PHQ - 2 Score  1  PHQ- 9 Score  3  Exception Documentation Patient refusal     {VISON DENTAL STD PSA (Optional):27386}  {History (Optional):23778}  Patient Care Team: Christen Butter, NP as PCP - General (Nurse Practitioner)   Outpatient Medications Prior to Visit  Medication Sig   meloxicam (MOBIC) 15 MG tablet Take 1 tablet (15 mg total) by mouth daily.   predniSONE (DELTASONE) 20 MG tablet Take 1 tablet (20 mg total) by mouth 2 (two) times daily with a meal.   No facility-administered medications prior to visit.    ROS        Objective:     There were no vitals taken for this visit. {Vitals History (Optional):23777}  Physical Exam   No results found for any visits on 11/10/21. {Show previous labs (optional):23779}    Assessment & Plan:    Routine Health Maintenance and Physical Exam  Immunization History  Administered Date(s) Administered   Influenza-Unspecified 01/20/2011   Janssen (J&J) SARS-COV-2 Vaccination 09/02/2019   Tdap 01/20/2011, 03/27/2014    Health Maintenance  Topic Date Due   Diabetic kidney evaluation - Urine ACR  Never done   COVID-19 Vaccine (2 - Booster for Janssen  series) 10/28/2019   Diabetic kidney evaluation - GFR measurement  10/01/2020   INFLUENZA VACCINE  10/25/2021   TETANUS/TDAP  03/27/2024   Hepatitis C Screening  Completed   HIV Screening  Completed   HPV VACCINES  Aged Out    Discussed health benefits of physical activity, and encouraged him to engage in regular exercise appropriate for his age and condition.  Problem List Items Addressed This Visit       Other   Vitamin D deficiency   Other Visit Diagnoses     Annual physical exam    -  Primary      No follow-ups on file.     Christen Butter, NP

## 2021-11-10 ENCOUNTER — Encounter: Payer: Self-pay | Admitting: Medical-Surgical

## 2021-11-10 ENCOUNTER — Ambulatory Visit (INDEPENDENT_AMBULATORY_CARE_PROVIDER_SITE_OTHER): Payer: BC Managed Care – PPO | Admitting: Medical-Surgical

## 2021-11-10 VITALS — BP 131/85 | HR 84 | Resp 20 | Ht 65.25 in | Wt 167.9 lb

## 2021-11-10 DIAGNOSIS — Z0000 Encounter for general adult medical examination without abnormal findings: Secondary | ICD-10-CM | POA: Diagnosis not present

## 2021-11-10 DIAGNOSIS — E559 Vitamin D deficiency, unspecified: Secondary | ICD-10-CM

## 2021-11-10 DIAGNOSIS — M25572 Pain in left ankle and joints of left foot: Secondary | ICD-10-CM

## 2021-11-11 LAB — LIPID PANEL
Cholesterol: 246 mg/dL — ABNORMAL HIGH (ref <200)
HDL: 46 mg/dL (ref >=40)
Non-HDL Cholesterol (Calc): 200 mg/dL (calc) — ABNORMAL HIGH (ref <130)
Total CHOL/HDL Ratio: 5.3 (calc) — ABNORMAL HIGH (ref <5.0)
Triglycerides: 408 mg/dL — ABNORMAL HIGH (ref <150)

## 2021-11-11 LAB — CBC WITH DIFFERENTIAL/PLATELET
Absolute Monocytes: 412 cells/uL (ref 200–950)
Basophils Absolute: 29 cells/uL (ref 0–200)
Basophils Relative: 0.5 %
Eosinophils Absolute: 232 cells/uL (ref 15–500)
Eosinophils Relative: 4 %
HCT: 44.9 % (ref 38.5–50.0)
Hemoglobin: 15.3 g/dL (ref 13.2–17.1)
Lymphs Abs: 1665 cells/uL (ref 850–3900)
MCH: 29.5 pg (ref 27.0–33.0)
MCHC: 34.1 g/dL (ref 32.0–36.0)
MCV: 86.7 fL (ref 80.0–100.0)
MPV: 12.2 fL (ref 7.5–12.5)
Monocytes Relative: 7.1 %
Neutro Abs: 3463 cells/uL (ref 1500–7800)
Neutrophils Relative %: 59.7 %
Platelets: 161 10*3/uL (ref 140–400)
RBC: 5.18 10*6/uL (ref 4.20–5.80)
RDW: 12.9 % (ref 11.0–15.0)
Total Lymphocyte: 28.7 %
WBC: 5.8 10*3/uL (ref 3.8–10.8)

## 2021-11-11 LAB — COMPLETE METABOLIC PANEL WITH GFR
AG Ratio: 2 (calc) (ref 1.0–2.5)
ALT: 30 U/L (ref 9–46)
AST: 22 U/L (ref 10–40)
Albumin: 4.6 g/dL (ref 3.6–5.1)
Alkaline phosphatase (APISO): 73 U/L (ref 36–130)
BUN: 18 mg/dL (ref 7–25)
CO2: 29 mmol/L (ref 20–32)
Calcium: 9.3 mg/dL (ref 8.6–10.3)
Chloride: 105 mmol/L (ref 98–110)
Creat: 1.22 mg/dL (ref 0.60–1.29)
Globulin: 2.3 g/dL (calc) (ref 1.9–3.7)
Glucose, Bld: 94 mg/dL (ref 65–99)
Potassium: 4.1 mmol/L (ref 3.5–5.3)
Sodium: 142 mmol/L (ref 135–146)
Total Bilirubin: 0.6 mg/dL (ref 0.2–1.2)
Total Protein: 6.9 g/dL (ref 6.1–8.1)
eGFR: 75 mL/min/{1.73_m2} (ref >=60)

## 2021-11-29 DIAGNOSIS — Z0000 Encounter for general adult medical examination without abnormal findings: Secondary | ICD-10-CM | POA: Diagnosis not present

## 2021-11-30 LAB — LIPID PANEL
Cholesterol: 204 mg/dL — ABNORMAL HIGH (ref <200)
HDL: 50 mg/dL (ref >=40)
LDL Cholesterol (Calc): 126 mg/dL (calc) — ABNORMAL HIGH
Non-HDL Cholesterol (Calc): 154 mg/dL (calc) — ABNORMAL HIGH (ref <130)
Total CHOL/HDL Ratio: 4.1 (calc) (ref <5.0)
Triglycerides: 166 mg/dL — ABNORMAL HIGH (ref <150)

## 2021-12-02 ENCOUNTER — Other Ambulatory Visit: Payer: Self-pay | Admitting: Medical-Surgical

## 2021-12-02 DIAGNOSIS — E782 Mixed hyperlipidemia: Secondary | ICD-10-CM

## 2021-12-02 MED ORDER — ATORVASTATIN CALCIUM 20 MG PO TABS: 20.0000 mg | ORAL_TABLET | Freq: Every day | ORAL | 3 refills | Status: DC

## 2021-12-09 ENCOUNTER — Telehealth: Payer: Self-pay | Admitting: Medical-Surgical

## 2021-12-09 NOTE — Telephone Encounter (Signed)
Patient called concerning his cholesterol being elevated states has  not heard anything back about medication or results . Would like a call back

## 2021-12-09 NOTE — Telephone Encounter (Signed)
Patient advised.

## 2022-01-31 ENCOUNTER — Ambulatory Visit (INDEPENDENT_AMBULATORY_CARE_PROVIDER_SITE_OTHER): Payer: BC Managed Care – PPO

## 2022-01-31 ENCOUNTER — Ambulatory Visit: Payer: BC Managed Care – PPO | Admitting: Family Medicine

## 2022-01-31 VITALS — BP 122/81 | HR 45 | Ht 65.0 in | Wt 164.0 lb

## 2022-01-31 DIAGNOSIS — M545 Low back pain, unspecified: Secondary | ICD-10-CM | POA: Diagnosis not present

## 2022-01-31 DIAGNOSIS — M5136 Other intervertebral disc degeneration, lumbar region: Secondary | ICD-10-CM

## 2022-01-31 DIAGNOSIS — G8929 Other chronic pain: Secondary | ICD-10-CM

## 2022-01-31 MED ORDER — MELOXICAM 15 MG PO TABS: 15.0000 mg | ORAL_TABLET | Freq: Every day | ORAL | 0 refills | Status: DC

## 2022-01-31 MED ORDER — CYCLOBENZAPRINE HCL 5 MG PO TABS: 5.0000 mg | ORAL_TABLET | Freq: Three times a day (TID) | ORAL | 0 refills | Status: DC | PRN

## 2022-01-31 NOTE — Assessment & Plan Note (Addendum)
-   believe patient is having chronic back pain that is similar to what he has experienced in the past  - will go ahead and order repeat xrays, with the possibility of ordering an MRI  - gave patient meloxicam and flexiril and educated and provided him low back stretches to do to try and help with the pain - recommended PT but patient said he would like to try home stretches first - have referred to Dr. Darene Lamer for further eval as pt saw him last year for this concern and had further recs

## 2022-01-31 NOTE — Progress Notes (Signed)
Acute Office Visit  Subjective:     Patient ID: Victor Jimenez, male    DOB: Jun 11, 1975, 46 y.o.   MRN: [id number]  Chief Complaint  Patient presents with   Back Pain    Pt c/o of lower back pain onset for 2 weeks,pt has hx of moderate heavy lifting at work    HPI Patient is in today for chronic low back pain. He has a hx of degenerative disc disease. Most recent xrays from April 2022 show grade 1 anterolithesis of L5-S1 secondary to bilateral L5 spondylolysis. At that time, he was seen and evaluated by Dr. Darene Lamer and went to PT.   Review of Systems  Constitutional:  Negative for chills and fever.  Respiratory:  Negative for cough and shortness of breath.   Cardiovascular:  Negative for chest pain.  Musculoskeletal:  Positive for back pain.  Neurological:  Negative for headaches.        Objective:    BP 122/81   Pulse (!) 45   Ht 5\' 5"  (1.651 m)   Wt 164 lb (74.4 kg)   SpO2 100%   BMI 27.29 kg/m    Physical Exam Vitals and nursing note reviewed.  Constitutional:      General: He is not in acute distress.    Appearance: Normal appearance.  HENT:     Head: Normocephalic and atraumatic.     Right Ear: External ear normal.     Left Ear: External ear normal.     Nose: Nose normal.  Eyes:     Conjunctiva/sclera: Conjunctivae normal.  Cardiovascular:     Rate and Rhythm: Normal rate and regular rhythm.  Pulmonary:     Effort: Pulmonary effort is normal.     Breath sounds: Normal breath sounds.  Musculoskeletal:     Comments: Tenderness to palpation of paraspinal muscles bilaterally   Neurological:     General: No focal deficit present.     Mental Status: He is alert and oriented to person, place, and time.  Psychiatric:        Mood and Affect: Mood normal.        Behavior: Behavior normal.        Thought Content: Thought content normal.        Judgment: Judgment normal.     No results found for any visits on 01/31/22.      Assessment & Plan:   Problem  List Items Addressed This Visit       Musculoskeletal and Integument   DDD (degenerative disc disease), lumbar - Primary    - believe patient is having chronic back pain that is similar to what he has experienced in the past  - will go ahead and order repeat xrays, with the possibility of ordering an MRI  - gave patient meloxicam and flexiril and educated and provided him low back stretches to do to try and help with the pain - recommended PT but patient said he would like to try home stretches first - have referred to Dr. Darene Lamer for further eval as pt saw him last year for this concern and had further recs      Relevant Medications   meloxicam (MOBIC) 15 MG tablet   cyclobenzaprine (FLEXERIL) 5 MG tablet   Other Relevant Orders   Ambulatory referral to Physical Therapy   DG Lumbar Spine Complete   Other Visit Diagnoses     Chronic bilateral low back pain without sciatica       Relevant Medications  meloxicam (MOBIC) 15 MG tablet   cyclobenzaprine (FLEXERIL) 5 MG tablet   Other Relevant Orders   DG Lumbar Spine Complete       Meds ordered this encounter  Medications   meloxicam (MOBIC) 15 MG tablet    Sig: Take 1 tablet (15 mg total) by mouth daily.    Dispense:  30 tablet    Refill:  0   cyclobenzaprine (FLEXERIL) 5 MG tablet    Sig: Take 1 tablet (5 mg total) by mouth 3 (three) times daily as needed for muscle spasms.    Dispense:  30 tablet    Refill:  0    Return Dr. Council Mechanic, DO

## 2022-02-07 ENCOUNTER — Ambulatory Visit: Payer: BC Managed Care – PPO | Admitting: Sports Medicine

## 2022-02-07 DIAGNOSIS — M5136 Other intervertebral disc degeneration, lumbar region: Secondary | ICD-10-CM

## 2022-02-07 DIAGNOSIS — M51369 Other intervertebral disc degeneration, lumbar region without mention of lumbar back pain or lower extremity pain: Secondary | ICD-10-CM

## 2022-02-07 MED ORDER — PREDNISONE 50 MG PO TABS: ORAL_TABLET | ORAL | 0 refills | Status: DC

## 2022-02-07 NOTE — Progress Notes (Addendum)
    Procedures performed today:    None.  Independent interpretation of notes and tests performed by another provider:   None.  Brief History, Exam, Impression, and Recommendations:    DDD (degenerative disc disease), lumbar [place] returns, he is a very pleasant 46 year old male, recurrence of chronic axial low back pain, x-rays previously did show L5-S1 DDD and spondylolisthesis. He responded to prednisone back in April. He did see one of my partners, he got some meloxicam and Flexeril, but still has significant pain. I do think it is probably time to go ahead and get an MRI for epidural planning, likely left L5-S1 interlaminar, however we will start with some prednisone to get him relief now. 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 After his MRI he will let me know if he would like to proceed with epidural. We did discuss wearing a back brace when at work and trying to maintain the most ergonomic positions for his back.  MRI reviewed, L5-S1 spondylolisthesis with bilateral foraminal stenosis and proceeding with left L5-S1 interlaminar epidural.    ____________________________________________ Ihor Austin. Benjamin Stain, M.D., ABFM., CAQSM., AME. Primary Care and Sports Medicine [facility]  Adjunct Professor of Family Medicine  [employer]  Restaurant manager, fast food

## 2022-02-07 NOTE — Assessment & Plan Note (Addendum)
Victor Jimenez returns, he is a very pleasant 46 year old male, recurrence of chronic axial low back pain, x-rays previously did show L5-S1 DDD and spondylolisthesis. He responded to prednisone back in April. He did see one of my partners, he got some meloxicam and Flexeril, but still has significant pain. I do think it is probably time to go ahead and get an MRI for epidural planning, likely left L5-S1 interlaminar, however we will start with some prednisone to get him relief now. 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 After his MRI he will let me know if he would like to proceed with epidural. We did discuss wearing a back brace when at work and trying to maintain the most ergonomic positions for his back.  MRI reviewed, L5-S1 spondylolisthesis with bilateral foraminal stenosis and proceeding with left L5-S1 interlaminar epidural.

## 2022-02-14 ENCOUNTER — Ambulatory Visit (INDEPENDENT_AMBULATORY_CARE_PROVIDER_SITE_OTHER): Payer: BC Managed Care – PPO

## 2022-02-14 DIAGNOSIS — M5126 Other intervertebral disc displacement, lumbar region: Secondary | ICD-10-CM | POA: Diagnosis not present

## 2022-02-14 DIAGNOSIS — M545 Low back pain, unspecified: Secondary | ICD-10-CM | POA: Diagnosis not present

## 2022-02-14 DIAGNOSIS — M5136 Other intervertebral disc degeneration, lumbar region: Secondary | ICD-10-CM | POA: Diagnosis not present

## 2022-02-15 NOTE — Addendum Note (Signed)
Addended by: Monica Becton on: 02/15/2022 10:26 AM   Modules accepted: Orders

## 2022-02-24 ENCOUNTER — Inpatient Hospital Stay
Admission: RE | Admit: 2022-02-24 | Discharge: 2022-02-24 | Disposition: A | Discharge status: Home / Self Care | Payer: BC Managed Care – PPO | Source: Ambulatory Visit | Attending: Sports Medicine | Admitting: Sports Medicine

## 2022-02-24 NOTE — Discharge Instructions (Signed)

## 2022-02-27 ENCOUNTER — Other Ambulatory Visit: Payer: Self-pay | Admitting: Family Medicine

## 2022-02-27 DIAGNOSIS — M5136 Other intervertebral disc degeneration, lumbar region: Secondary | ICD-10-CM

## 2022-03-16 DIAGNOSIS — J208 Acute bronchitis due to other specified organisms: Secondary | ICD-10-CM | POA: Diagnosis not present

## 2022-03-16 DIAGNOSIS — R519 Headache, unspecified: Secondary | ICD-10-CM | POA: Diagnosis not present

## 2022-03-16 DIAGNOSIS — Z20822 Contact with and (suspected) exposure to covid-19: Secondary | ICD-10-CM | POA: Diagnosis not present

## 2022-10-25 DIAGNOSIS — L719 Rosacea, unspecified: Secondary | ICD-10-CM | POA: Diagnosis not present

## 2022-10-25 DIAGNOSIS — L209 Atopic dermatitis, unspecified: Secondary | ICD-10-CM | POA: Diagnosis not present

## 2022-11-14 ENCOUNTER — Encounter: Payer: Self-pay | Admitting: Medical-Surgical

## 2022-11-14 ENCOUNTER — Ambulatory Visit (INDEPENDENT_AMBULATORY_CARE_PROVIDER_SITE_OTHER): Payer: BC Managed Care – PPO | Admitting: Medical-Surgical

## 2022-11-14 VITALS — BP 118/79 | HR 50 | Ht 66.0 in | Wt 164.0 lb

## 2022-11-14 DIAGNOSIS — M5136 Other intervertebral disc degeneration, lumbar region: Secondary | ICD-10-CM | POA: Diagnosis not present

## 2022-11-14 DIAGNOSIS — E559 Vitamin D deficiency, unspecified: Secondary | ICD-10-CM | POA: Diagnosis not present

## 2022-11-14 DIAGNOSIS — Z0000 Encounter for general adult medical examination without abnormal findings: Secondary | ICD-10-CM | POA: Diagnosis not present

## 2022-11-14 DIAGNOSIS — E782 Mixed hyperlipidemia: Secondary | ICD-10-CM | POA: Diagnosis not present

## 2022-11-14 NOTE — Progress Notes (Signed)
Complete physical exam  Patient: Victor Jimenez   DOB: 1975/10/02   47 y.o. Male  MRN: [id number]  Subjective:    Chief Complaint  Patient presents with   Annual Exam    Victor Jimenez is a 47 y.o. male who presents today for a complete physical exam. He reports consuming a general diet.  Just got restarted playing soccer.   He generally feels well. He reports sleeping well. He does not have additional problems to discuss today.    Most recent fall risk assessment:    11/14/2022    9:25 AM  Fall Risk   Falls in the past year? 0  Number falls in past yr: 0  Injury with Fall? 0  Risk for fall due to : No Fall Risks  Follow up Falls evaluation completed     Most recent depression screenings:    11/14/2022    9:26 AM 01/31/2022    8:15 AM  PHQ 2/9 Scores  PHQ - 2 Score 0 0    Vision:Not within last year , Dental: No current dental problems and Receives regular dental care, and STD: The patient denies history of sexually transmitted disease.    Patient Care Team: Christen Butter, NP as PCP - General (Nurse Practitioner)   Outpatient Medications Prior to Visit  Medication Sig   [DISCONTINUED] atorvastatin (LIPITOR) 20 MG tablet Take 1 tablet (20 mg total) by mouth daily. (Patient not taking: Reported on 11/14/2022)   [DISCONTINUED] cyclobenzaprine (FLEXERIL) 5 MG tablet Take 1 tablet (5 mg total) by mouth 3 (three) times daily as needed for muscle spasms. (Patient not taking: Reported on 11/14/2022)   [DISCONTINUED] doxycycline (PERIOSTAT) 20 MG tablet Take 20 mg by mouth 2 (two) times daily. (Patient not taking: Reported on 11/14/2022)   [DISCONTINUED] hydrocortisone 2.5 % cream Apply topically 2 (two) times daily. (Patient not taking: Reported on 11/14/2022)   [DISCONTINUED] meloxicam (MOBIC) 15 MG tablet Take 1 tablet (15 mg total) by mouth daily. (Patient not taking: Reported on 11/14/2022)   [DISCONTINUED] methylPREDNISolone (MEDROL DOSEPAK) 4 MG TBPK tablet See admin  instructions. (Patient not taking: Reported on 11/14/2022)   [DISCONTINUED] methylPREDNISolone (MEDROL DOSEPAK) 4 MG TBPK tablet See admin instructions. (Patient not taking: Reported on 11/14/2022)   [DISCONTINUED] metroNIDAZOLE (METROGEL) 1 % gel Apply topically at bedtime. (Patient not taking: Reported on 11/14/2022)   [DISCONTINUED] naproxen (NAPROSYN) 500 MG tablet Take by mouth. (Patient not taking: Reported on 11/14/2022)   [DISCONTINUED] predniSONE (DELTASONE) 50 MG tablet Hold meloxicam and take one tab PO daily for 5 days.  May restart meloxicam afterwards (Patient not taking: Reported on 11/14/2022)   [DISCONTINUED] promethazine-dextromethorphan (PROMETHAZINE-DM) 6.25-15 MG/5ML syrup Take by mouth. (Patient not taking: Reported on 11/14/2022)   No facility-administered medications prior to visit.   Review of Systems  Constitutional:  Negative for chills, fever, malaise/fatigue and weight loss.  HENT:  Negative for congestion, ear pain, hearing loss, sinus pain and sore throat.   Eyes:  Negative for blurred vision, photophobia and pain.  Respiratory:  Negative for cough, shortness of breath and wheezing.   Cardiovascular:  Negative for chest pain, palpitations and leg swelling.  Gastrointestinal:  Negative for abdominal pain, constipation, diarrhea, heartburn, nausea and vomiting.  Genitourinary:  Negative for dysuria, frequency and urgency.  Musculoskeletal:  Positive for joint pain and myalgias. Negative for falls and neck pain.  Skin:  Negative for itching and rash.  Neurological:  Negative for dizziness, weakness and headaches.  Endo/Heme/Allergies:  Negative for polydipsia.  Does not bruise/bleed easily.  Psychiatric/Behavioral:  Negative for depression, substance abuse and suicidal ideas. The patient has insomnia. The patient is not nervous/anxious.      Objective:    BP 118/79   Pulse (!) 50   Ht 5\' 6"  (1.676 m)   Wt 164 lb 0.6 oz (74.4 kg)   SpO2 98%   BMI 26.48 kg/m     Physical Exam Vitals reviewed.  Constitutional:      General: He is not in acute distress.    Appearance: Normal appearance. He is not ill-appearing.  HENT:     Head: Normocephalic and atraumatic.     Right Ear: Tympanic membrane, ear canal and external ear normal. There is no impacted cerumen.     Left Ear: Tympanic membrane, ear canal and external ear normal. There is no impacted cerumen.     Nose: Nose normal. No congestion or rhinorrhea.     Mouth/Throat:     Mouth: Mucous membranes are moist.     Pharynx: No oropharyngeal exudate or posterior oropharyngeal erythema.  Eyes:     General: No scleral icterus.       Right eye: No discharge.        Left eye: No discharge.     Extraocular Movements: Extraocular movements intact.     Conjunctiva/sclera: Conjunctivae normal.     Pupils: Pupils are equal, round, and reactive to light.  Neck:     Thyroid: No thyromegaly.     Vascular: No carotid bruit or JVD.     Trachea: Trachea normal.  Cardiovascular:     Rate and Rhythm: Normal rate and regular rhythm.     Pulses: Normal pulses.     Heart sounds: Normal heart sounds. No murmur heard.    No friction rub. No gallop.  Pulmonary:     Effort: Pulmonary effort is normal. No respiratory distress.     Breath sounds: Normal breath sounds. No wheezing.  Abdominal:     General: Bowel sounds are normal. There is no distension.     Palpations: Abdomen is soft.     Tenderness: There is no abdominal tenderness. There is no guarding.  Musculoskeletal:        General: Normal range of motion.     Cervical back: Normal range of motion and neck supple.  Lymphadenopathy:     Cervical: No cervical adenopathy.  Skin:    General: Skin is warm and dry.  Neurological:     Mental Status: He is alert and oriented to person, place, and time.     Cranial Nerves: No cranial nerve deficit.  Psychiatric:        Mood and Affect: Mood normal.        Behavior: Behavior normal.        Thought  Content: Thought content normal.        Judgment: Judgment normal.   No results found for any visits on 11/14/22.     Assessment & Plan:    Routine Health Maintenance and Physical Exam  Immunization History  Administered Date(s) Administered   Influenza-Unspecified 01/20/2011   Janssen (J&J) SARS-COV-2 Vaccination 09/02/2019   Tdap 01/20/2011, 03/27/2014    Health Maintenance  Topic Date Due   COVID-19 Vaccine (2 - 2023-24 season) 11/25/2021   INFLUENZA VACCINE  06/25/2023 (Originally 10/26/2022)   DTaP/Tdap/Td (3 - Td or Tdap) 03/27/2024   Hepatitis C Screening  Completed   HIV Screening  Completed   HPV VACCINES  Aged Out  Discussed health benefits of physical activity, and encouraged him to engage in regular exercise appropriate for his age and condition.  1. Annual physical exam Checking labs as below.  Up-to-date on preventative care.  Wellness information provided with AVS. - CBC with Differential/Platelet - CMP14+EGFR - Lipid panel  2. Mixed hyperlipidemia History of hyperlipidemia and was prescribed atorvastatin but has not been taking this regularly.  Rechecking lipids today.  3. DDD (degenerative disc disease), lumbar Continues to have intermittent issues with lumbar back pain as well as various migrating joint pains.  He does work a physically active job and is now back to playing soccer so this is not surprising.  Okay to use ibuprofen/Tylenol as needed.  4. Vitamin D deficiency Checking vitamin D today. - VITAMIN D 25 Hydroxy (Vit-D Deficiency, Fractures)   Return in about 1 year (around 11/14/2023) for annual physical exam or sooner if needed.   Christen Butter, NP

## 2022-11-15 ENCOUNTER — Encounter: Payer: Self-pay | Admitting: Medical-Surgical

## 2022-11-15 LAB — CBC WITH DIFFERENTIAL/PLATELET
Basophils Absolute: 0 10*3/uL (ref 0.0–0.2)
Basos: 1 %
EOS (ABSOLUTE): 0.4 10*3/uL (ref 0.0–0.4)
Eos: 7 %
Hematocrit: 44.4 % (ref 37.5–51.0)
Hemoglobin: 14.7 g/dL (ref 13.0–17.7)
Immature Grans (Abs): 0 10*3/uL (ref 0.0–0.1)
Immature Granulocytes: 0 %
Lymphocytes Absolute: 1.8 10*3/uL (ref 0.7–3.1)
Lymphs: 34 %
MCH: 28.4 pg (ref 26.6–33.0)
MCHC: 33.1 g/dL (ref 31.5–35.7)
MCV: 86 fL (ref 79–97)
Monocytes Absolute: 0.3 10*3/uL (ref 0.1–0.9)
Monocytes: 6 %
Neutrophils Absolute: 2.8 10*3/uL (ref 1.4–7.0)
Neutrophils: 52 %
Platelets: 185 10*3/uL (ref 150–450)
RBC: 5.17 x10E6/uL (ref 4.14–5.80)
RDW: 12.7 % (ref 11.6–15.4)
WBC: 5.4 10*3/uL (ref 3.4–10.8)

## 2022-11-15 LAB — LIPID PANEL
Chol/HDL Ratio: 4.5 ratio (ref 0.0–5.0)
Cholesterol, Total: 214 mg/dL — ABNORMAL HIGH (ref 100–199)
HDL: 48 mg/dL (ref >39)
LDL Chol Calc (NIH): 145 mg/dL — ABNORMAL HIGH (ref 0–99)
Triglycerides: 117 mg/dL (ref 0–149)
VLDL Cholesterol Cal: 21 mg/dL (ref 5–40)

## 2022-11-15 LAB — CMP14+EGFR
ALT: 29 IU/L (ref 0–44)
AST: 23 IU/L (ref 0–40)
Albumin: 4.6 g/dL (ref 4.1–5.1)
Alkaline Phosphatase: 86 IU/L (ref 44–121)
BUN/Creatinine Ratio: 18 (ref 9–20)
BUN: 21 mg/dL (ref 6–24)
Bilirubin Total: 0.6 mg/dL (ref 0.0–1.2)
CO2: 24 mmol/L (ref 20–29)
Calcium: 9.2 mg/dL (ref 8.7–10.2)
Chloride: 105 mmol/L (ref 96–106)
Creatinine, Ser: 1.19 mg/dL (ref 0.76–1.27)
Globulin, Total: 2.3 g/dL (ref 1.5–4.5)
Glucose: 88 mg/dL (ref 70–99)
Potassium: 4.6 mmol/L (ref 3.5–5.2)
Sodium: 141 mmol/L (ref 134–144)
Total Protein: 6.9 g/dL (ref 6.0–8.5)
eGFR: 77 mL/min/{1.73_m2} (ref >59)

## 2022-11-15 LAB — VITAMIN D 25 HYDROXY (VIT D DEFICIENCY, FRACTURES): Vit D, 25-Hydroxy: 17.1 ng/mL — ABNORMAL LOW (ref 30.0–100.0)

## 2022-11-15 MED ORDER — VITAMIN D (ERGOCALCIFEROL) 1.25 MG (50000 UNIT) PO CAPS: 50000.0000 [IU] | ORAL_CAPSULE | ORAL | 0 refills | Status: DC

## 2022-11-15 NOTE — Addendum Note (Signed)
Addended byChristen Butter on: 11/15/2022 07:57 AM   Modules accepted: Orders

## 2023-02-27 DIAGNOSIS — H00012 Hordeolum externum right lower eyelid: Secondary | ICD-10-CM | POA: Diagnosis not present

## 2023-02-27 DIAGNOSIS — H00011 Hordeolum externum right upper eyelid: Secondary | ICD-10-CM | POA: Diagnosis not present

## 2023-02-27 DIAGNOSIS — H00015 Hordeolum externum left lower eyelid: Secondary | ICD-10-CM | POA: Diagnosis not present

## 2023-02-27 DIAGNOSIS — H00014 Hordeolum externum left upper eyelid: Secondary | ICD-10-CM | POA: Diagnosis not present

## 2023-04-02 ENCOUNTER — Encounter: Payer: Self-pay | Admitting: Family Medicine

## 2023-04-02 ENCOUNTER — Ambulatory Visit (INDEPENDENT_AMBULATORY_CARE_PROVIDER_SITE_OTHER): Payer: BC Managed Care – PPO | Admitting: Family Medicine

## 2023-04-02 VITALS — BP 138/89 | HR 73 | Ht 66.0 in | Wt 169.2 lb

## 2023-04-02 DIAGNOSIS — L309 Dermatitis, unspecified: Secondary | ICD-10-CM

## 2023-04-02 MED ORDER — CETIRIZINE HCL 10 MG PO TABS
10.0000 mg | ORAL_TABLET | Freq: Every day | ORAL | 0 refills | Status: DC
Start: 2023-04-02 — End: 2023-11-15

## 2023-04-02 MED ORDER — CLINDAMYCIN PHOSPHATE 1 % EX GEL: Freq: Two times a day (BID) | CUTANEOUS | 0 refills | Status: DC

## 2023-04-02 NOTE — Assessment & Plan Note (Signed)
 Facial rash present for two months with papules and pustules present. On exam it almost looks like acne and the fact that doxycycline helped would support this theory. I will send in clindagel and then zyrtec  to help with some itching. I am wondering if the itching is from irritation with his face being dry. Metronidazole didn't work which makes me think rosacea is less likely on the differential. I did recommend pt get back in contact with dermatology for further follow up. He would like a referral to allergy and wants allergy testing. Have placed referral

## 2023-04-02 NOTE — Progress Notes (Signed)
 Established patient visit   Patient: Victor Jimenez   DOB: 05/12/1975   48 y.o. Male  MRN: [id number] Visit Date: 04/02/2023  Today's healthcare provider: Bernice GORMAN Juneau, DO   Chief Complaint  Patient presents with   Rash    Pt has red splotches and red bumps on his face and eyes states they just itch no pain    SUBJECTIVE    Chief Complaint  Patient presents with   Rash    Pt has red splotches and red bumps on his face and eyes states they just itch no pain   Rash Pertinent negatives include no cough, fatigue, fever or shortness of breath.   HPI     Rash    Additional comments: Pt has red splotches and red bumps on his face and eyes states they just itch no pain      Last edited by Duwaine Riggs, CMA on 04/02/2023  3:23 PM.      Pt presents for acute visit of facial rash for two months. He was seen by dermatology and given metronidazole cream but notes no improvement. He proceeded to urgent care and was diagnosed with a stye and given doxycycline and a medrol dose pack. He says the medications work for the time being and then stopped.   He presents today with continue rash and admits to facial itching. Denies any new soaps, detergents, etc.rash is localized to face. No one else in family has it.   Review of Systems  Constitutional:  Negative for activity change, fatigue and fever.  Respiratory:  Negative for cough and shortness of breath.   Cardiovascular:  Negative for chest pain.  Gastrointestinal:  Negative for abdominal pain.  Genitourinary:  Negative for difficulty urinating.  Skin:  Positive for rash.       Current Meds  Medication Sig   cetirizine  (ZYRTEC ) 10 MG tablet Take 1 tablet (10 mg total) by mouth daily.   clindamycin  (CLINDAGEL) 1 % gel Apply topically 2 (two) times daily.   Vitamin D , Ergocalciferol , (DRISDOL ) 1.25 MG (50000 UNIT) CAPS capsule Take 1 capsule (50,000 Units total) by mouth every 7 (seven) days. Take for 12 total doses(weeks)  then switch to over-the-counter vitamin D3 1000-2000 units daily.    OBJECTIVE    BP 138/89 (BP Location: Left Arm, Patient Position: Sitting, Cuff Size: Large)   Pulse 73   Ht 5' 6 (1.676 m)   Wt 169 lb 4 oz (76.8 kg)   SpO2 100%   BMI 27.32 kg/m   Physical Exam Vitals and nursing note reviewed.  Constitutional:      General: He is not in acute distress.    Appearance: Normal appearance.  HENT:     Head: Normocephalic and atraumatic.     Right Ear: External ear normal.     Left Ear: External ear normal.     Nose: Nose normal.  Eyes:     Conjunctiva/sclera: Conjunctivae normal.  Cardiovascular:     Rate and Rhythm: Normal rate.  Pulmonary:     Effort: Pulmonary effort is normal.  Skin:    Comments: Papules and some pustular lesions scattered on patient forehead, cheeks, nose and chin  Neurological:     General: No focal deficit present.     Mental Status: He is alert and oriented to person, place, and time.  Psychiatric:        Mood and Affect: Mood normal.        Behavior: Behavior normal.  Thought Content: Thought content normal.        Judgment: Judgment normal.        ASSESSMENT & PLAN    Problem List Items Addressed This Visit       Musculoskeletal and Integument   Dermatitis - Primary   Facial rash present for two months with papules and pustules present. On exam it almost looks like acne and the fact that doxycycline helped would support this theory. I will send in clindagel and then zyrtec  to help with some itching. I am wondering if the itching is from irritation with his face being dry. Metronidazole didn't work which makes me think rosacea is less likely on the differential. I did recommend pt get back in contact with dermatology for further follow up. He would like a referral to allergy and wants allergy testing. Have placed referral      Relevant Orders   Ambulatory referral to Allergy    Return if symptoms worsen or fail to improve with  PCP.      Meds ordered this encounter  Medications   cetirizine  (ZYRTEC ) 10 MG tablet    Sig: Take 1 tablet (10 mg total) by mouth daily.    Dispense:  30 tablet    Refill:  0   clindamycin  (CLINDAGEL) 1 % gel    Sig: Apply topically 2 (two) times daily.    Dispense:  30 g    Refill:  0    Orders Placed This Encounter  Procedures   Ambulatory referral to Allergy    Referral Priority:   Routine    Referral Type:   Allergy Testing    Referral Reason:   Specialty Services Required    Requested Specialty:   Allergy    Number of Visits Requested:   1     Bernice GORMAN Juneau, DO  [facility] at [facility] [phone] (phone) [phone] (fax)  [facility]

## 2023-05-16 ENCOUNTER — Ambulatory Visit: Payer: BC Managed Care – PPO | Admitting: Internal Medicine

## 2023-06-15 ENCOUNTER — Ambulatory Visit: Payer: BC Managed Care – PPO | Admitting: Internal Medicine

## 2023-06-27 DIAGNOSIS — L719 Rosacea, unspecified: Secondary | ICD-10-CM | POA: Diagnosis not present

## 2023-08-08 DIAGNOSIS — L700 Acne vulgaris: Secondary | ICD-10-CM | POA: Diagnosis not present

## 2023-08-08 DIAGNOSIS — L719 Rosacea, unspecified: Secondary | ICD-10-CM | POA: Diagnosis not present

## 2023-11-14 NOTE — Progress Notes (Unsigned)
   Complete physical exam  Patient: Victor Jimenez   DOB: 1975-10-26   47 y.o. Male  MRN: [id number]  Subjective:    No chief complaint on file.   Victor Jimenez is a 48 y.o. male who presents today for a complete physical exam. He reports consuming a {diet types:17450} diet. {types:19826} He generally feels {DESC; WELL/FAIRLY WELL/POORLY:18703}. He reports sleeping {DESC; WELL/FAIRLY WELL/POORLY:18703}. He {does/does not:200015} have additional problems to discuss today.    Most recent fall risk assessment:    11/14/2022    9:25 AM  Fall Risk   Falls in the past year? 0  Number falls in past yr: 0  Injury with Fall? 0  Risk for fall due to : No Fall Risks  Follow up Falls evaluation completed     Most recent depression screenings:    11/14/2022    9:26 AM 01/31/2022    8:15 AM  PHQ 2/9 Scores  PHQ - 2 Score 0 0    {VISON DENTAL STD PSA (Optional):27386}  {History (Optional):23778}  Patient Care Team: Willo Mini, NP as PCP - General (Nurse Practitioner)   Outpatient Medications Prior to Visit  Medication Sig   cetirizine  (ZYRTEC ) 10 MG tablet Take 1 tablet (10 mg total) by mouth daily.   clindamycin  (CLINDAGEL) 1 % gel Apply topically 2 (two) times daily.   Vitamin D , Ergocalciferol , (DRISDOL ) 1.25 MG (50000 UNIT) CAPS capsule Take 1 capsule (50,000 Units total) by mouth every 7 (seven) days. Take for 12 total doses(weeks) then switch to over-the-counter vitamin D3 1000-2000 units daily.   No facility-administered medications prior to visit.    ROS        Objective:     There were no vitals taken for this visit. {Vitals History (Optional):23777}  Physical Exam   No results found for any visits on 11/15/23. {Show previous labs (optional):23779}    Assessment & Plan:    Routine Health Maintenance and Physical Exam  Immunization History  Administered Date(s) Administered   Influenza-Unspecified 01/20/2011   Janssen (J&J) SARS-COV-2 Vaccination  09/02/2019   Tdap 01/20/2011, 03/27/2014    Health Maintenance  Topic Date Due   Hepatitis B Vaccines 19-59 Average Risk (1 of 3 - 19+ 3-dose series) Never done   HPV VACCINES (1 - 3-dose SCDM series) Never done   COVID-19 Vaccine (2 - 2024-25 season) 11/26/2022   Colonoscopy  Never done   INFLUENZA VACCINE  10/26/2023   DTaP/Tdap/Td (3 - Td or Tdap) 03/27/2024   Hepatitis C Screening  Completed   HIV Screening  Completed   Pneumococcal Vaccine  Aged Out   Meningococcal B Vaccine  Aged Out    Discussed health benefits of physical activity, and encouraged him to engage in regular exercise appropriate for his age and condition.  Problem List Items Addressed This Visit       Other   Vitamin D  deficiency   Other Visit Diagnoses       Annual physical exam    -  Primary      No follow-ups on file.     Toshia Larkin, NP

## 2023-11-15 ENCOUNTER — Ambulatory Visit (INDEPENDENT_AMBULATORY_CARE_PROVIDER_SITE_OTHER): Admitting: Medical-Surgical

## 2023-11-15 ENCOUNTER — Encounter: Payer: Self-pay | Admitting: Medical-Surgical

## 2023-11-15 VITALS — BP 118/71 | HR 58 | Resp 20 | Ht 66.0 in | Wt 166.0 lb

## 2023-11-15 DIAGNOSIS — Z0000 Encounter for general adult medical examination without abnormal findings: Secondary | ICD-10-CM

## 2023-11-15 DIAGNOSIS — E559 Vitamin D deficiency, unspecified: Secondary | ICD-10-CM

## 2023-11-15 DIAGNOSIS — Z1211 Encounter for screening for malignant neoplasm of colon: Secondary | ICD-10-CM | POA: Diagnosis not present

## 2023-11-15 DIAGNOSIS — L309 Dermatitis, unspecified: Secondary | ICD-10-CM

## 2023-11-15 MED ORDER — CLINDAMYCIN PHOS-BENZOYL PEROX 1-5 % EX GEL: Freq: Two times a day (BID) | CUTANEOUS | 2 refills | Status: AC

## 2023-11-15 NOTE — Patient Instructions (Signed)

## 2023-11-16 ENCOUNTER — Ambulatory Visit: Payer: Self-pay | Admitting: Medical-Surgical

## 2023-11-16 LAB — CBC WITH DIFFERENTIAL/PLATELET
Basophils Absolute: 0.1 x10E3/uL (ref 0.0–0.2)
Basos: 1 %
EOS (ABSOLUTE): 0.3 x10E3/uL (ref 0.0–0.4)
Eos: 5 %
Hematocrit: 48.1 % (ref 37.5–51.0)
Hemoglobin: 15.8 g/dL (ref 13.0–17.7)
Immature Grans (Abs): 0 x10E3/uL (ref 0.0–0.1)
Immature Granulocytes: 0 %
Lymphocytes Absolute: 2 x10E3/uL (ref 0.7–3.1)
Lymphs: 37 %
MCH: 29.4 pg (ref 26.6–33.0)
MCHC: 32.8 g/dL (ref 31.5–35.7)
MCV: 89 fL (ref 79–97)
Monocytes Absolute: 0.4 x10E3/uL (ref 0.1–0.9)
Monocytes: 7 %
Neutrophils Absolute: 2.8 x10E3/uL (ref 1.4–7.0)
Neutrophils: 50 %
Platelets: 182 x10E3/uL (ref 150–450)
RBC: 5.38 x10E6/uL (ref 4.14–5.80)
RDW: 12.9 % (ref 11.6–15.4)
WBC: 5.5 x10E3/uL (ref 3.4–10.8)

## 2023-11-16 LAB — LIPID PANEL
Chol/HDL Ratio: 4.9 ratio (ref 0.0–5.0)
Cholesterol, Total: 232 mg/dL — ABNORMAL HIGH (ref 100–199)
HDL: 47 mg/dL (ref >39)
LDL Chol Calc (NIH): 164 mg/dL — ABNORMAL HIGH (ref 0–99)
Triglycerides: 118 mg/dL (ref 0–149)
VLDL Cholesterol Cal: 21 mg/dL (ref 5–40)

## 2023-11-16 LAB — CMP14+EGFR
ALT: 27 IU/L (ref 0–44)
AST: 20 IU/L (ref 0–40)
Albumin: 4.7 g/dL (ref 4.1–5.1)
Alkaline Phosphatase: 88 IU/L (ref 44–121)
BUN/Creatinine Ratio: 16 (ref 9–20)
BUN: 18 mg/dL (ref 6–24)
Bilirubin Total: 0.6 mg/dL (ref 0.0–1.2)
CO2: 21 mmol/L (ref 20–29)
Calcium: 9.3 mg/dL (ref 8.7–10.2)
Chloride: 106 mmol/L (ref 96–106)
Creatinine, Ser: 1.14 mg/dL (ref 0.76–1.27)
Globulin, Total: 2.4 g/dL (ref 1.5–4.5)
Glucose: 91 mg/dL (ref 70–99)
Potassium: 4.3 mmol/L (ref 3.5–5.2)
Sodium: 141 mmol/L (ref 134–144)
Total Protein: 7.1 g/dL (ref 6.0–8.5)
eGFR: 81 mL/min/1.73 (ref >59)

## 2023-11-16 LAB — VITAMIN D 25 HYDROXY (VIT D DEFICIENCY, FRACTURES): Vit D, 25-Hydroxy: 14.7 ng/mL — ABNORMAL LOW (ref 30.0–100.0)

## 2023-11-16 MED ORDER — VITAMIN D (ERGOCALCIFEROL) 1.25 MG (50000 UNIT) PO CAPS: 50000.0000 [IU] | ORAL_CAPSULE | ORAL | 0 refills | Status: AC

## 2023-11-27 ENCOUNTER — Encounter: Payer: Self-pay | Admitting: Sports Medicine

## 2024-11-17 ENCOUNTER — Encounter: Admitting: Medical-Surgical
